# Patient Record
Sex: Female | Born: 1945
Health system: Southern US, Community
[De-identification: ages and names within clinical notes are randomized; demographics above are authoritative.]

## PROBLEM LIST (undated history)

## (undated) DIAGNOSIS — E039 Hypothyroidism, unspecified: Secondary | ICD-10-CM

## (undated) DIAGNOSIS — E785 Hyperlipidemia, unspecified: Secondary | ICD-10-CM

## (undated) HISTORY — PX: FACIAL FRACTURE SURGERY: SHX1570

## (undated) HISTORY — DX: Hypothyroidism, unspecified: E03.9

## (undated) HISTORY — DX: Hyperlipidemia, unspecified: E78.5

## (undated) HISTORY — PX: ABDOMINAL HYSTERECTOMY: SHX81

## (undated) HISTORY — PX: SPINE SURGERY: SHX786

---

## 1986-03-04 HISTORY — PX: SPINE SURGERY: SHX786

## 2006-03-04 DIAGNOSIS — I219 Acute myocardial infarction, unspecified: Secondary | ICD-10-CM

## 2006-03-04 HISTORY — DX: Acute myocardial infarction, unspecified: I21.9

## 2011-02-21 DIAGNOSIS — M810 Age-related osteoporosis without current pathological fracture: Secondary | ICD-10-CM

## 2011-02-21 DIAGNOSIS — M8580 Other specified disorders of bone density and structure, unspecified site: Secondary | ICD-10-CM | POA: Insufficient documentation

## 2011-02-21 HISTORY — DX: Age-related osteoporosis without current pathological fracture: M81.0

## 2013-09-09 DIAGNOSIS — E785 Hyperlipidemia, unspecified: Secondary | ICD-10-CM | POA: Insufficient documentation

## 2013-09-09 DIAGNOSIS — I5189 Other ill-defined heart diseases: Secondary | ICD-10-CM | POA: Insufficient documentation

## 2013-09-09 DIAGNOSIS — R002 Palpitations: Secondary | ICD-10-CM | POA: Insufficient documentation

## 2013-09-09 HISTORY — DX: Other ill-defined heart diseases: I51.89

## 2013-09-09 HISTORY — DX: Palpitations: R00.2

## 2013-09-30 ENCOUNTER — Ambulatory Visit: Payer: Self-pay | Admitting: Rheumatology

## 2013-10-12 DIAGNOSIS — M1711 Unilateral primary osteoarthritis, right knee: Secondary | ICD-10-CM

## 2013-10-12 DIAGNOSIS — S83289A Other tear of lateral meniscus, current injury, unspecified knee, initial encounter: Secondary | ICD-10-CM

## 2013-10-12 HISTORY — DX: Unilateral primary osteoarthritis, right knee: M17.11

## 2013-10-12 HISTORY — DX: Other tear of lateral meniscus, current injury, unspecified knee, initial encounter: S83.289A

## 2013-11-05 ENCOUNTER — Ambulatory Visit: Payer: Self-pay | Admitting: Unknown Physician Specialty

## 2013-12-02 DIAGNOSIS — Z9889 Other specified postprocedural states: Secondary | ICD-10-CM

## 2013-12-02 HISTORY — DX: Other specified postprocedural states: Z98.890

## 2014-06-15 ENCOUNTER — Ambulatory Visit
Admit: 2014-06-15 | Disposition: A | Discharge status: Home / Self Care | Payer: Self-pay | Attending: Nurse Practitioner | Admitting: Nurse Practitioner

## 2015-06-07 ENCOUNTER — Other Ambulatory Visit: Payer: Self-pay | Admitting: Nurse Practitioner

## 2015-06-07 DIAGNOSIS — Z1231 Encounter for screening mammogram for malignant neoplasm of breast: Secondary | ICD-10-CM

## 2016-07-10 LAB — HM COLONOSCOPY

## 2016-11-29 DIAGNOSIS — B009 Herpesviral infection, unspecified: Secondary | ICD-10-CM

## 2016-11-29 DIAGNOSIS — E039 Hypothyroidism, unspecified: Secondary | ICD-10-CM | POA: Insufficient documentation

## 2016-11-29 HISTORY — DX: Herpesviral infection, unspecified: B00.9

## 2016-11-29 HISTORY — DX: Hypothyroidism, unspecified: E03.9

## 2019-12-27 ENCOUNTER — Ambulatory Visit (INDEPENDENT_AMBULATORY_CARE_PROVIDER_SITE_OTHER): Payer: Medicare PPO | Admitting: Nurse Practitioner

## 2019-12-27 ENCOUNTER — Other Ambulatory Visit: Payer: Self-pay

## 2019-12-27 ENCOUNTER — Encounter: Payer: Self-pay | Admitting: Nurse Practitioner

## 2019-12-27 VITALS — BP 130/68 | HR 68 | Temp 97.3°F | Ht 58.5 in | Wt 112.0 lb

## 2019-12-27 DIAGNOSIS — E559 Vitamin D deficiency, unspecified: Secondary | ICD-10-CM

## 2019-12-27 DIAGNOSIS — M545 Low back pain, unspecified: Secondary | ICD-10-CM

## 2019-12-27 DIAGNOSIS — Z23 Encounter for immunization: Secondary | ICD-10-CM

## 2019-12-27 DIAGNOSIS — K5904 Chronic idiopathic constipation: Secondary | ICD-10-CM | POA: Insufficient documentation

## 2019-12-27 DIAGNOSIS — E039 Hypothyroidism, unspecified: Secondary | ICD-10-CM

## 2019-12-27 DIAGNOSIS — G8929 Other chronic pain: Secondary | ICD-10-CM | POA: Diagnosis not present

## 2019-12-27 DIAGNOSIS — Z780 Asymptomatic menopausal state: Secondary | ICD-10-CM

## 2019-12-27 DIAGNOSIS — E782 Mixed hyperlipidemia: Secondary | ICD-10-CM | POA: Diagnosis not present

## 2019-12-27 DIAGNOSIS — Z1231 Encounter for screening mammogram for malignant neoplasm of breast: Secondary | ICD-10-CM

## 2019-12-27 DIAGNOSIS — M2550 Pain in unspecified joint: Secondary | ICD-10-CM | POA: Diagnosis not present

## 2019-12-27 DIAGNOSIS — M8580 Other specified disorders of bone density and structure, unspecified site: Secondary | ICD-10-CM

## 2019-12-27 HISTORY — DX: Other chronic pain: G89.29

## 2019-12-27 HISTORY — DX: Low back pain, unspecified: M54.50

## 2019-12-27 HISTORY — DX: Chronic idiopathic constipation: K59.04

## 2019-12-27 LAB — CBC WITH DIFFERENTIAL/PLATELET
Basophils Absolute: 0 10*3/uL (ref 0.0–0.1)
Basophils Relative: 0.5 % (ref 0.0–3.0)
Eosinophils Absolute: 0.1 10*3/uL (ref 0.0–0.7)
Eosinophils Relative: 0.9 % (ref 0.0–5.0)
HCT: 38.3 % (ref 36.0–46.0)
Hemoglobin: 13 g/dL (ref 12.0–15.0)
Lymphocytes Relative: 26.7 % (ref 12.0–46.0)
Lymphs Abs: 1.9 10*3/uL (ref 0.7–4.0)
MCHC: 33.8 g/dL (ref 30.0–36.0)
MCV: 97.5 fl (ref 78.0–100.0)
Monocytes Absolute: 0.4 10*3/uL (ref 0.1–1.0)
Monocytes Relative: 5.8 % (ref 3.0–12.0)
Neutro Abs: 4.6 10*3/uL (ref 1.4–7.7)
Neutrophils Relative %: 66.1 % (ref 43.0–77.0)
Platelets: 212 10*3/uL (ref 150.0–400.0)
RBC: 3.93 Mil/uL (ref 3.87–5.11)
RDW: 12.8 % (ref 11.5–15.5)
WBC: 7 10*3/uL (ref 4.0–10.5)

## 2019-12-27 LAB — COMPREHENSIVE METABOLIC PANEL
ALT: 15 U/L (ref 0–35)
AST: 22 U/L (ref 0–37)
Albumin: 4.6 g/dL (ref 3.5–5.2)
Alkaline Phosphatase: 73 U/L (ref 39–117)
BUN: 8 mg/dL (ref 6–23)
CO2: 27 mEq/L (ref 19–32)
Calcium: 9.8 mg/dL (ref 8.4–10.5)
Chloride: 104 mEq/L (ref 96–112)
Creatinine, Ser: 0.63 mg/dL (ref 0.40–1.20)
GFR: 88.29 mL/min (ref >60.00)
Glucose, Bld: 77 mg/dL (ref 70–99)
Potassium: 4.1 mEq/L (ref 3.5–5.1)
Sodium: 141 mEq/L (ref 135–145)
Total Bilirubin: 0.6 mg/dL (ref 0.2–1.2)
Total Protein: 6.7 g/dL (ref 6.0–8.3)

## 2019-12-27 LAB — LIPID PANEL
Cholesterol: 184 mg/dL (ref 0–200)
HDL: 92.8 mg/dL (ref >39.00)
LDL Cholesterol: 75 mg/dL (ref 0–99)
NonHDL: 91.04
Total CHOL/HDL Ratio: 2
Triglycerides: 81 mg/dL (ref 0.0–149.0)
VLDL: 16.2 mg/dL (ref 0.0–40.0)

## 2019-12-27 LAB — T4, FREE: Free T4: 0.83 ng/dL (ref 0.60–1.60)

## 2019-12-27 LAB — SEDIMENTATION RATE: Sed Rate: 9 mm/hr (ref 0–30)

## 2019-12-27 LAB — CK: Total CK: 71 U/L (ref 7–177)

## 2019-12-27 LAB — TSH: TSH: 0.93 u[IU]/mL (ref 0.35–4.50)

## 2019-12-27 MED ORDER — SHINGRIX 50 MCG/0.5ML IM SUSR: 0.5000 mL | Freq: Once | INTRAMUSCULAR | 0 refills | Status: AC

## 2019-12-27 MED ORDER — SIMVASTATIN 20 MG PO TABS: 20.0000 mg | ORAL_TABLET | Freq: Every day | ORAL | 3 refills | Status: DC

## 2019-12-27 MED ORDER — MELOXICAM 7.5 MG PO TABS: 7.5000 mg | ORAL_TABLET | Freq: Every day | ORAL | 5 refills | Status: DC

## 2019-12-27 MED ORDER — LEVOTHYROXINE SODIUM 50 MCG PO TABS: 50.0000 ug | ORAL_TABLET | Freq: Every day | ORAL | 1 refills | Status: DC

## 2019-12-27 MED ORDER — GABAPENTIN 300 MG PO CAPS: 300.0000 mg | ORAL_CAPSULE | Freq: Two times a day (BID) | ORAL | 11 refills | Status: DC

## 2019-12-27 NOTE — Patient Instructions (Addendum)
Thank you for choosing [facility] for your health needs.  Go to lab for blood draw.  Increase gabapentin to 300mg  BID Start meloxicam 1tab daily with food. Do not take any OTC ibuprofen/naproxen/advil/aleve while taking meloxicam.  Let me know if you change your mind about outpatient PT. Start daily yoga exercise.  Low Back Sprain or Strain Rehab Ask your health care provider which exercises are safe for you. Do exercises exactly as told by your health care provider and adjust them as directed. It is normal to feel mild stretching, pulling, tightness, or discomfort as you do these exercises. Stop right away if you feel sudden pain or your pain gets worse. Do not begin these exercises until told by your health care provider. Stretching and range-of-motion exercises These exercises warm up your muscles and joints and improve the movement and flexibility of your back. These exercises also help to relieve pain, numbness, and tingling. Lumbar rotation  1. Lie on your back on a firm surface and bend your knees. 2. Straighten your arms out to your sides so each arm forms a 90-degree angle (right angle) with a side of your body. 3. Slowly move (rotate) both of your knees to one side of your body until you feel a stretch in your lower back (lumbar). Try not to let your shoulders lift off the floor. 4. Hold this position for __________ seconds. 5. Tense your abdominal muscles and slowly move your knees back to the starting position. 6. Repeat this exercise on the other side of your body. Repeat __________ times. Complete this exercise __________ times a day. Single knee to chest  1. Lie on your back on a firm surface with both legs straight. 2. Bend one of your knees. Use your hands to move your knee up toward your chest until you feel a gentle stretch in your lower back and buttock. ? Hold your leg in this position by holding on to the front of your knee. ? Keep your other leg as  straight as possible. 3. Hold this position for __________ seconds. 4. Slowly return to the starting position. 5. Repeat with your other leg. Repeat __________ times. Complete this exercise __________ times a day. Prone extension on elbows  1. Lie on your abdomen on a firm surface (prone position). 2. Prop yourself up on your elbows. 3. Use your arms to help lift your chest up until you feel a gentle stretch in your abdomen and your lower back. ? This will place some of your body weight on your elbows. If this is uncomfortable, try stacking pillows under your chest. ? Your hips should stay down, against the surface that you are lying on. Keep your hip and back muscles relaxed. 4. Hold this position for __________ seconds. 5. Slowly relax your upper body and return to the starting position. Repeat __________ times. Complete this exercise __________ times a day. Strengthening exercises These exercises build strength and endurance in your back. Endurance is the ability to use your muscles for a long time, even after they get tired. Pelvic tilt This exercise strengthens the muscles that lie deep in the abdomen. 1. Lie on your back on a firm surface. Bend your knees and keep your feet flat on the floor. 2. Tense your abdominal muscles. Tip your pelvis up toward the ceiling and flatten your lower back into the floor. ? To help with this exercise, you may place a small towel under your lower back and try to push your back  into the towel. 3. Hold this position for __________ seconds. 4. Let your muscles relax completely before you repeat this exercise. Repeat __________ times. Complete this exercise __________ times a day. Alternating arm and leg raises  1. Get on your hands and knees on a firm surface. If you are on a hard floor, you may want to use padding, such as an exercise mat, to cushion your knees. 2. Line up your arms and legs. Your hands should be directly below your shoulders, and your  knees should be directly below your hips. 3. Lift your left leg behind you. At the same time, raise your right arm and straighten it in front of you. ? Do not lift your leg higher than your hip. ? Do not lift your arm higher than your shoulder. ? Keep your abdominal and back muscles tight. ? Keep your hips facing the ground. ? Do not arch your back. ? Keep your balance carefully, and do not hold your breath. 4. Hold this position for __________ seconds. 5. Slowly return to the starting position. 6. Repeat with your right leg and your left arm. Repeat __________ times. Complete this exercise __________ times a day. Abdominal set with straight leg raise  1. Lie on your back on a firm surface. 2. Bend one of your knees and keep your other leg straight. 3. Tense your abdominal muscles and lift your straight leg up, 4-6 inches (10-15 cm) off the ground. 4. Keep your abdominal muscles tight and hold this position for __________ seconds. ? Do not hold your breath. ? Do not arch your back. Keep it flat against the ground. 5. Keep your abdominal muscles tense as you slowly lower your leg back to the starting position. 6. Repeat with your other leg. Repeat __________ times. Complete this exercise __________ times a day. Single leg lower with bent knees 1. Lie on your back on a firm surface. 2. Tense your abdominal muscles and lift your feet off the floor, one foot at a time, so your knees and hips are bent in 90-degree angles (right angles). ? Your knees should be over your hips and your lower legs should be parallel to the floor. 3. Keeping your abdominal muscles tense and your knee bent, slowly lower one of your legs so your toe touches the ground. 4. Lift your leg back up to return to the starting position. ? Do not hold your breath. ? Do not let your back arch. Keep your back flat against the ground. 5. Repeat with your other leg. Repeat __________ times. Complete this exercise __________  times a day. Posture and body mechanics Good posture and healthy body mechanics can help to relieve stress in your body's tissues and joints. Body mechanics refers to the movements and positions of your body while you do your daily activities. Posture is part of body mechanics. Good posture means:  Your spine is in its natural S-curve position (neutral).  Your shoulders are pulled back slightly.  Your head is not tipped forward. Follow these guidelines to improve your posture and body mechanics in your everyday activities. Standing   When standing, keep your spine neutral and your feet about hip width apart. Keep a slight bend in your knees. Your ears, shoulders, and hips should line up.  When you do a task in which you stand in one place for a long time, place one foot up on a stable object that is 2-4 inches (5-10 cm) high, such as a footstool. This helps  keep your spine neutral. Sitting   When sitting, keep your spine neutral and keep your feet flat on the floor. Use a footrest, if necessary, and keep your thighs parallel to the floor. Avoid rounding your shoulders, and avoid tilting your head forward.  When working at a desk or a computer, keep your desk at a height where your hands are slightly lower than your elbows. Slide your chair under your desk so you are close enough to maintain good posture.  When working at a computer, place your monitor at a height where you are looking straight ahead and you do not have to tilt your head forward or downward to look at the screen. Resting  When lying down and resting, avoid positions that are most painful for you.  If you have pain with activities such as sitting, bending, stooping, or squatting, lie in a position in which your body does not bend very much. For example, avoid curling up on your side with your arms and knees near your chest (fetal position).  If you have pain with activities such as standing for a long time or reaching  with your arms, lie with your spine in a neutral position and bend your knees slightly. Try the following positions: ? Lying on your side with a pillow between your knees. ? Lying on your back with a pillow under your knees. Lifting   When lifting objects, keep your feet at least shoulder width apart and tighten your abdominal muscles.  Bend your knees and hips and keep your spine neutral. It is important to lift using the strength of your legs, not your back. Do not lock your knees straight out.  Always ask for help to lift heavy or awkward objects. This information is not intended to replace advice given to you by your health care provider. Make sure you discuss any questions you have with your health care provider. Document Revised: 06/12/2018 Document Reviewed: 03/12/2018 Elsevier Patient Education  2020 [employer].

## 2019-12-27 NOTE — Assessment & Plan Note (Signed)
Repeat TSh and T4 today

## 2019-12-27 NOTE — Assessment & Plan Note (Signed)
Associated with AM stiffness lasting> 1hr. interferes with sleep. 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 She denies any falls in last 2years. Denies any radiculopathy and weakness No change with tylenol or ibuprofen Some improvement with gabapentin 3034mat hs. Denies any adverse side effects. No FHx of RA or lupus Due to home responsibilities she is not able to attend outpt PT sessions.  Normal CMP, TSH, ESR and CK Pending R-factor and vit. D Increase gabapentin to 30055mID Start meloxicam 7.5mg39mily Advised to start Yoga exercises at home F/up in 1mon75month

## 2019-12-27 NOTE — Progress Notes (Signed)
Subjective:    Patient ID: Lindsay Ferguson, female    DOB: 30-Jan-1946, 74 y.o.   MRN: [id number]  Patient presents today for establish care (new patient) and eval of chronic conditions  Reviewed office notes, lab results and radiology report from previous pcp with [facility].  HPI Chronic midline low back pain without sciatica Ongoing for years, Worse in last 2years. Associated with AM stiffness lasting> 1hr. interferes with sleep. 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 She denies any falls in last 2years. Denies any radiculopathy and weakness No change with tylenol or ibuprofen Some improvement with gabapentin 3020mat hs. Denies any adverse side effects. No FHx of RA or lupus Has FHx of severe OA. [facility], facet lumbar and scoliosis Due to home responsibilities she is not able to attend outpt PT sessions.  Normal CMP, TSH, ESR and CK Pending R-factor and vit. D Increase gabapentin to 30074mID Start meloxicam 7.5mg43mily Advised to start Yoga exercises at home F/up in 1mon56monthteopenia Last bone density 2015 per patient. Order entered for repeat dexa scan  Hypothyroidism, unspecified Repeat TSh and T4 today  Chronic idiopathic constipation Up to date with colonoscopy BM every other day with use of colace 200mg 79m Unable to afford linzess Consider amitiza if colace no longer effective  Hyperlipidemia Current use of zocor normal CK and sed rate LDL at goal Continue zocor Lipid Panel     Component Value Date/Time   CHOL 184 12/27/2019 1227   TRIG 81.0 12/27/2019 1227   HDL 92.80 12/27/2019 1227   CHOLHDL 2 12/27/2019 1227   VLDL 16.2 12/27/2019 1227   LDLCALC 75 12/27/2019 1227    Chronic pain of multiple joints Associated with AM stiffness lasting> 1hr. interferes with sleep. Reports increased stress and anxiety in last 27yrs b67yrse she became her sister's  caregiver. She denies any falls in last 2years. Denies any radiculopathy and weakness No change with tylenol or ibuprofen Some improvement with gabapentin 300mg at3m Denies any adverse side effects. No FHx of RA or lupus Due to home responsibilities she is not able to attend outpt PT sessions.  Normal CMP, TSH, ESR and CK Pending R-factor and vit. D Increase gabapentin to 300mg BID40mrt meloxicam 7.5mg daily61mvised to start Yoga exercises at home F/up in 50month.  P50month: normal, s/p total hystrectomy. AWV done 08/08/2019  Depression/Suicide: Depression screen PHQ 2/9 12/27/2019  Decreased Interest 0  Down, Depressed, Hopeless 0  PHQ - 2 Score 0  Altered sleeping 2  Tired, decreased energy 2  Change in appetite 0  Feeling bad or failure about yourself  0  Trouble concentrating 0  Moving slowly or fidgety/restless 0  Suicidal thoughts 0  PHQ-9 Score 4  Difficult doing work/chores Not difficult at all   Immunizations: (TDAP, Hep C screen, Pneumovax, Influenza, zoster)  Health Maintenance  Topic Date Due   Tetanus Vaccine  Never done   DEXA scan (bone density measurement)  Never done   Mammogram  08/30/2020   Colon Cancer Screening  07/02/2021   Flu Shot  Completed   COVID-19 Vaccine  Completed    Hepatitis C: One time screening is recommended by Center for Disease Control  (CDC) for  adults born from 1944 throug[birth year]65.   Completed   Pneumonia vaccines  Completed   Fall Risk: Fall Risk  12/27/2019  Falls in the past year? 0  Number falls in past yr: 0  Injury with Fall? 0  Risk for fall due to : No Fall Risks  Follow up Falls evaluation completed   Advanced Directive: Advanced Directives 12/27/2019  Does Patient Have a Medical Advance Directive? No  Would patient like information on creating a medical advance directive? Yes (MAU/Ambulatory/Procedural Areas - Information given)     Medications and allergies reviewed with patient and updated if  appropriate.  Patient Active Problem List   Diagnosis Date Noted   Chronic pain of multiple joints 12/27/2019   Chronic midline low back pain without sciatica 12/27/2019   Chronic idiopathic constipation 12/27/2019   Hypothyroidism, unspecified 11/29/2016   Status post arthroscopic surgery of right knee 12/02/2013   Primary osteoarthritis of right knee 10/12/2013   Hyperlipidemia 09/09/2013   Heart chamber dysfunction 09/09/2013   Osteopenia 02/21/2011    Current Outpatient Medications on File Prior to Visit  Medication Sig Dispense Refill   acyclovir (ZOVIRAX) 400 MG tablet Take 400 mg by mouth 4 (four) times daily as needed.     Aspirin Buf,CaCarb-MgCarb-MgO, 81 MG TABS Take 81 mg by mouth daily.     No current facility-administered medications on file prior to visit.    Past Medical History:  Diagnosis Date   Herpesviral infection, unspecified 11/29/2016   Hyperlipidemia    Hypothyroidism    Tear of lateral cartilage or meniscus of knee, current 10/12/2013    Past Surgical History:  Procedure Laterality Date   ABDOMINAL HYSTERECTOMY     FACIAL FRACTURE SURGERY     SPINE SURGERY      Social History   Socioeconomic History   Marital status: Married    Spouse name: Not on file   Number of children: Not on file   Years of education: Not on file   Highest education level: Not on file  Occupational History   Not on file  Tobacco Use   Smoking status: Never Smoker   Smokeless tobacco: Never Used  Vaping Use   Vaping Use: Never used  Substance and Sexual Activity   Alcohol use: Yes    Comment: Occasionally (wine)   Drug use: Never   Sexual activity: Not Currently  Other Topics Concern   Not on file  Social History Narrative   Not on file   Social Determinants of Health   Financial Resource Strain:    Difficulty of Paying Living Expenses: Not on file  Food Insecurity:    Worried About [place] in the Last Year:  Not on file   Ran Out of Food in the Last Year: Not on file  Transportation Needs:    Lack of Transportation (Medical): Not on file   Lack of Transportation (Non-Medical): Not on file  Physical Activity:    Days of Exercise per Week: Not on file   Minutes of Exercise per Session: Not on file  Stress:    Feeling of Stress : Not on file  Social Connections:    Frequency of Communication with Friends and Family: Not on file   Frequency of Social Gatherings with Friends and Family: Not on file   Attends Religious Services: Not on file   Active Member of Clubs or Organizations: Not on file   Attends Archivist Meetings: Not on file   Marital Status: Not on file    History reviewed. No pertinent family history.      ROS  Objective:   Vitals:   12/27/19 1129  BP: 130/68  Pulse: 68  Temp: (!) 97.3 F (36.3 C)  SpO2: 97%    Body mass index is 23.01 kg/m.   Physical Examination:  Physical Exam Vitals reviewed.  Cardiovascular:     Rate and Rhythm: Normal rate and regular rhythm.     Pulses: Normal pulses.     Heart sounds: Normal heart sounds.  Pulmonary:     Effort: Pulmonary effort is normal.     Breath sounds: Normal breath sounds.  Abdominal:     General: Bowel sounds are normal.     Palpations: Abdomen is soft.  Musculoskeletal:     Cervical back: Normal, normal range of motion and neck supple.     Thoracic back: Normal. No scoliosis.     Lumbar back: Negative right straight leg raise test and negative left straight leg raise test. Scoliosis present.     Right hip: Normal.     Left hip: Tenderness present. No bony tenderness or crepitus. Normal range of motion. Normal strength.     Right lower leg: No edema.     Left lower leg: No edema.  Lymphadenopathy:     Cervical: No cervical adenopathy.  Skin:    General: Skin is warm and dry.     Findings: No rash.  Neurological:     Mental Status: She is alert and oriented to person, place,  and time.  Psychiatric:        Attention and Perception: Attention normal.        Mood and Affect: Mood is anxious.        Speech: Speech normal.        Behavior: Behavior is cooperative.        Thought Content: Thought content normal.        Cognition and Memory: Cognition normal.        Judgment: Judgment normal.    ASSESSMENT and PLAN: This visit occurred during the SARS-CoV-2 public health emergency.  Safety protocols were in place, including screening questions prior to the visit, additional usage of staff PPE, and extensive cleaning of exam room while observing appropriate contact time as indicated for disinfecting solutions.   Johnni was seen today for establish care.  Diagnoses and all orders for this visit:  Hypothyroidism, unspecified type -     TSH -     T4, free -     levothyroxine (SYNTHROID) 50 MCG tablet; Take 1 tablet (50 mcg total) by mouth daily before breakfast.  Mixed hyperlipidemia -     Lipid panel -     simvastatin (ZOCOR) 20 MG tablet; Take 1 tablet (20 mg total) by mouth at bedtime.  Chronic midline low back pain without sciatica -     gabapentin (NEURONTIN) 300 MG capsule; Take 1 capsule (300 mg total) by mouth 2 (two) times daily. -     meloxicam (MOBIC) 7.5 MG tablet; Take 1 tablet (7.5 mg total) by mouth daily. With food  Chronic pain of multiple joints -     Comprehensive metabolic panel -     CBC with Differential/Platelet -     CK (Creatine Kinase) -     gabapentin (NEURONTIN) 300 MG capsule; Take 1 capsule (300 mg total) by mouth 2 (two) times daily. -     meloxicam (MOBIC) 7.5 MG tablet; Take 1 tablet (7.5 mg total) by mouth daily. With food -     Sedimentation rate -     Rheumatoid Factor  Vitamin D insufficiency -     Vitamin  D 1,25 dihydroxy  Asymptomatic age-related postmenopausal state -     DG Bone Density; Future  Breast cancer screening by mammogram -     MM DIGITAL SCREENING BILATERAL; Future  Need for zoster vaccine -      Zoster Vaccine Adjuvanted [facility]) injection; Inject 0.5 mLs into the muscle once for 1 dose.  Chronic idiopathic constipation  Osteopenia, unspecified location      Problem List Items Addressed This Visit      Digestive   Chronic idiopathic constipation    Up to date with colonoscopy BM every other day with use of colace 242m EOD. Unable to afford linzess Consider amitiza if colace no longer effective        Endocrine   Hypothyroidism, unspecified - Primary    Repeat TSh and T4 today      Relevant Medications   levothyroxine (SYNTHROID) 50 MCG tablet   Other Relevant Orders   TSH (Completed)   T4, free (Completed)     Musculoskeletal and Integument   Osteopenia    Last bone density 2015 per patient. Order entered for repeat dexa scan        Other   Chronic midline low back pain without sciatica    Ongoing for years, Worse in last 2years. Associated with AM stiffness lasting> 1hr. interferes with sleep. Reports increased stress and anxiety in last 22yrbecause she became her sister's caregiver. She denies any falls in last 2years. Denies any radiculopathy and weakness No change with tylenol or ibuprofen Some improvement with gabapentin 3004mt hs. Denies any adverse side effects. No FHx of RA or lupus Has FHx of severe OA. [facility] facet lumbar and scoliosis Due to home responsibilities she is not able to attend outpt PT sessions.  Normal CMP, TSH, ESR and CK Pending R-factor and vit. D Increase gabapentin to 300m56mD Start meloxicam 7.5mg 46mly Advised to start Yoga exercises at home F/up in 1mont54month Relevant Medications   Aspirin Buf,CaCarb-MgCarb-MgO, 81 MG TABS   gabapentin (NEURONTIN) 300 MG capsule   meloxicam (MOBIC) 7.5 MG tablet   Chronic pain of multiple joints    Associated with AM stiffness lasting> 1hr. interferes with sleep. Reports increased stress and anxiety in last 44yrs b944yrse she  became her sister's caregiver. She denies any falls in last 2years. Denies any radiculopathy and weakness No change with tylenol or ibuprofen Some improvement with gabapentin 300mg at46m Denies any adverse side effects. No FHx of RA or lupus Due to home responsibilities she is not able to attend outpt PT sessions.  Normal CMP, TSH, ESR and CK Pending R-factor and vit. D Increase gabapentin to 300mg BID30mrt meloxicam 7.5mg daily66mvised to start Yoga exercises at home F/up in 7month.   79monthvant Medications   Aspirin Buf,CaCarb-MgCarb-MgO, 81 MG TABS   gabapentin (NEURONTIN) 300 MG capsule   meloxicam (MOBIC) 7.5 MG tablet   Other Relevant Orders   Comprehensive metabolic panel (Completed)   CBC with Differential/Platelet (Completed)   CK (Creatine Kinase) (Completed)   Sedimentation rate (Completed)   Rheumatoid Factor   Hyperlipidemia    Current use of zocor normal CK and sed rate LDL at goal Continue zocor Lipid Panel     Component Value Date/Time   CHOL 184 12/27/2019 1227   TRIG 81.0 12/27/2019 1227   HDL 92.80 12/27/2019 1227   CHOLHDL 2 12/27/2019 1227   VLDL 16.2 12/27/2019  [place] 75 12/27/2019 1227        Relevant Medications   Aspirin Buf,CaCarb-MgCarb-MgO, 81 MG TABS   simvastatin (ZOCOR) 20 MG tablet   Other Relevant Orders   Lipid panel (Completed)    Other Visit Diagnoses    Vitamin D insufficiency       Relevant Orders   Vitamin D 1,25 dihydroxy   Asymptomatic age-related postmenopausal state       Relevant Orders   DG Bone Density   Breast cancer screening by mammogram       Relevant Orders   MM DIGITAL SCREENING BILATERAL   Need for zoster vaccine       Relevant Medications   Zoster Vaccine Adjuvanted [facility]) injection      Follow up: Return in about 4 weeks (around 01/24/2020) for chronic back and joint pain (F2F, 50mns).  CWilfred Lacy NP

## 2019-12-27 NOTE — Assessment & Plan Note (Addendum)
Current use of zocor normal CK and sed rate LDL at goal Continue zocor Lipid Panel     Component Value Date/Time   CHOL 184 12/27/2019 1227   TRIG 81.0 12/27/2019 1227   HDL 92.80 12/27/2019 1227   CHOLHDL 2 12/27/2019 1227   VLDL 16.2 12/27/2019 1227   LDLCALC 75 12/27/2019 1227

## 2019-12-27 NOTE — Assessment & Plan Note (Addendum)
Ongoing for years, Worse in last 2years. Associated with AM stiffness lasting> 1hr. interferes with sleep. 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 She denies any falls in last 2years. Denies any radiculopathy and weakness No change with tylenol or ibuprofen Some improvement with gabapentin 3012mat hs. Denies any adverse side effects. No FHx of RA or lupus Has FHx of severe OA. [facility] facet lumbar and scoliosis Due to home responsibilities she is not able to attend outpt PT sessions.  Normal CMP, TSH, ESR and CK Pending R-factor and vit. D Increase gabapentin to 30075mID Start meloxicam 7.5mg45mily Advised to start Yoga exercises at home F/up in 1mon64month

## 2019-12-27 NOTE — Assessment & Plan Note (Addendum)
Last bone density 2015 per patient. Order entered for repeat dexa scan

## 2019-12-27 NOTE — Assessment & Plan Note (Signed)
Up to date with colonoscopy BM every other day with use of colace 200mg  EOD. Unable to afford linzess Consider amitiza if colace no longer effective

## 2019-12-28 LAB — RHEUMATOID FACTOR: Rhuematoid fact SerPl-aCnc: <14 IU/mL (ref <14)

## 2019-12-31 LAB — VITAMIN D 1,25 DIHYDROXY
Vitamin D 1, 25 (OH)2 Total: 50 pg/mL (ref 18–72)
Vitamin D2 1, 25 (OH)2: <8 pg/mL
Vitamin D3 1, 25 (OH)2: 50 pg/mL

## 2020-01-03 ENCOUNTER — Telehealth: Payer: Self-pay | Admitting: Nurse Practitioner

## 2020-01-03 NOTE — Telephone Encounter (Signed)
Patient declined AWV stating she is caring for her sister who had a stroke and do want to do the AWV due to her time restraint

## 2020-02-01 ENCOUNTER — Other Ambulatory Visit: Payer: Self-pay

## 2020-02-02 ENCOUNTER — Ambulatory Visit (INDEPENDENT_AMBULATORY_CARE_PROVIDER_SITE_OTHER): Payer: Medicare PPO | Admitting: Nurse Practitioner

## 2020-02-02 ENCOUNTER — Encounter: Payer: Self-pay | Admitting: Nurse Practitioner

## 2020-02-02 VITALS — BP 104/70 | HR 72 | Temp 97.4°F | Ht 59.5 in | Wt 111.8 lb

## 2020-02-02 DIAGNOSIS — G8929 Other chronic pain: Secondary | ICD-10-CM | POA: Diagnosis not present

## 2020-02-02 DIAGNOSIS — M545 Low back pain, unspecified: Secondary | ICD-10-CM

## 2020-02-02 DIAGNOSIS — M2550 Pain in unspecified joint: Secondary | ICD-10-CM | POA: Diagnosis not present

## 2020-02-02 NOTE — Progress Notes (Signed)
   Subjective:  Patient ID: Lindsay Ferguson, female    DOB: 06-15-1945  Age: 74 y.o. MRN: [id number]  CC: Follow-up (1 month f/u on chronic back pain. Pt states since starting medication she has had lots of improvement. )  HPI  Chronic midline low back pain without sciatica Reports significant improvement in back and joint pain with current use of meloxicam and gabapentin BID. Also reports improved mood and sleep. Denies any adverse side effects with medications. Maintain current dose.  Reviewed past Medical, Social and Family history today.  Outpatient Medications Prior to Visit  Medication Sig Dispense Refill  . acyclovir (ZOVIRAX) 400 MG tablet Take 400 mg by mouth 4 (four) times daily as needed.    . Aspirin Buf,CaCarb-MgCarb-MgO, 81 MG TABS Take 81 mg by mouth daily.    Marland Kitchen gabapentin (NEURONTIN) 300 MG capsule Take 1 capsule (300 mg total) by mouth 2 (two) times daily. 60 capsule 11  . levothyroxine (SYNTHROID) 50 MCG tablet Take 1 tablet (50 mcg total) by mouth daily before breakfast. 90 tablet 1  . meloxicam (MOBIC) 7.5 MG tablet Take 1 tablet (7.5 mg total) by mouth daily. With food 30 tablet 5  . simvastatin (ZOCOR) 20 MG tablet Take 1 tablet (20 mg total) by mouth at bedtime. 90 tablet 3   No facility-administered medications prior to visit.    ROS See HPI  Objective:  BP 104/70 (BP Location: Left Arm, Patient Position: Sitting, Cuff Size: Normal)   Pulse 72   Temp (!) 97.4 F (36.3 C) (Temporal)   Ht 4' 11.5" (1.511 m)   Wt 111 lb 12.8 oz (50.7 kg)   SpO2 98%   BMI 22.20 kg/m   Physical Exam Cardiovascular:     Rate and Rhythm: Normal rate.     Pulses: Normal pulses.  Pulmonary:     Effort: Pulmonary effort is normal.  Musculoskeletal:     Right lower leg: No edema.     Left lower leg: No edema.  Neurological:     Mental Status: She is alert and oriented to person, place, and time.    Assessment & Plan:  This visit occurred during the SARS-CoV-2  public health emergency.  Safety protocols were in place, including screening questions prior to the visit, additional usage of staff PPE, and extensive cleaning of exam room while observing appropriate contact time as indicated for disinfecting solutions.   Fable was seen today for follow-up.  Diagnoses and all orders for this visit:  Chronic pain of multiple joints  Chronic midline low back pain without sciatica   Problem List Items Addressed This Visit      Other   Chronic midline low back pain without sciatica    Reports significant improvement in back and joint pain with current use of meloxicam and gabapentin BID. Also reports improved mood and sleep. Denies any adverse side effects with medications. Maintain current dose.      Chronic pain of multiple joints - Primary      Follow-up: Return in about 6 months (around 08/02/2020) for hyperlipidemia hypothyroidism (fasting).  Alysia Penna, NP

## 2020-02-02 NOTE — Assessment & Plan Note (Signed)
Reports significant improvement in back and joint pain with current use of meloxicam and gabapentin BID. Also reports improved mood and sleep. Denies any adverse side effects with medications. Maintain current dose.

## 2020-02-14 ENCOUNTER — Telehealth: Payer: Self-pay

## 2020-02-14 NOTE — Telephone Encounter (Signed)
Patient calling and explained that she has had some  bleeding from both big toe nails, and that it looks infected to her, x 3 days.  Pt said that she read that this is a side affect from Mobic and would like call back or a Mychart message sent on what she should do.  Please advise.

## 2020-02-15 NOTE — Telephone Encounter (Signed)
Pt states she is only comfortable coming to this office and does not want to try another [place] location nor does she want to go to urgent care. Appt has been scheduled for tomorrow.

## 2020-02-15 NOTE — Telephone Encounter (Signed)
Needs an appt. Ok to schedule with another provider 

## 2020-02-16 ENCOUNTER — Other Ambulatory Visit: Payer: Self-pay

## 2020-02-16 ENCOUNTER — Ambulatory Visit (INDEPENDENT_AMBULATORY_CARE_PROVIDER_SITE_OTHER): Payer: Medicare PPO | Admitting: Nurse Practitioner

## 2020-02-16 ENCOUNTER — Encounter: Payer: Self-pay | Admitting: Nurse Practitioner

## 2020-02-16 VITALS — BP 130/76 | HR 70 | Temp 97.5°F | Ht 59.5 in | Wt 111.4 lb

## 2020-02-16 DIAGNOSIS — S90229A Contusion of unspecified lesser toe(s) with damage to nail, initial encounter: Secondary | ICD-10-CM

## 2020-02-16 NOTE — Patient Instructions (Signed)
Subungual Hematoma A subungual hematoma is a collection of blood under a fingernail or toenail. It can cause pain and a blue area under the nail. What are the causes? This condition is caused by an injury to a finger or toe that breaks a blood vessel under the nail. It can result from:  A hard, direct hit to a finger or toe (crush injury).  Pressure being put on a finger or toe over and over again, such as pressure on a toe from running. What are the signs or symptoms?   A blue or dark blue color under the nail.  Pain or throbbing in the injured area. How is this treated? Treatment is often not needed for this condition. The pain often goes away in a few days, and the dark color under the nail will go away as the nail grows. If treatment is needed, your doctor may:  Do a procedure to drain the blood from under the nail. This may be done if you have a lot of pain or if a lot of blood collects under the nail.  Remove the nail. This may be done if there is a cut under the nail that needs stitches (sutures). Follow these instructions at home: Managing pain, stiffness, and swelling   If told, put ice on the area. ? Put ice in a plastic bag. ? Place a towel between your skin and the bag. ? Leave the ice on for 20 minutes, 2-3 times a day.  Raise (elevate) the injured finger or toe above the level of your heart while you are sitting or lying down. Doing this will help with pain and swelling. Injury care  Follow instructions from your doctor about how to take care of your injury. Make sure you: ? Change any bandage (dressing) as told by your doctor. ? Wash your hands with soap and water before you change your bandage. If you cannot use soap and water, use hand sanitizer. ? Leave stitches (sutures) in place. You may have these if your doctor fixed a cut under the nail. The stitches may need to stay in place for 2 weeks or longer.  If part of your nail falls off, gently trim the rest of  the nail. General instructions  Take over-the-counter and prescription medicines only as told by your doctor.  Return to your normal activities as told by your doctor. Ask your doctor what activities are safe for you.  Keep all follow-up visits as told by your doctor. This is important. Contact a doctor if you have:  Pain that is not helped by medicine.  A fever.  Redness, swelling, or pain around your nail. Get help right away if you have:  Fluid, blood, or pus coming from your nail. Summary  A subungual hematoma is a collection of blood under a fingernail or toenail.  It can cause pain and a blue area under the nail.  Treatment is often not needed for this condition.  Raise the injured finger or toe above the level of your heart while you are sitting or lying down. This information is not intended to replace advice given to you by your health care provider. Make sure you discuss any questions you have with your health care provider. Document Revised: 07/24/2017 Document Reviewed: 07/24/2017 Elsevier Patient Education  2020 [employer] Paronychia Paronychia is an infection of the skin. It happens near a fingernail or toenail. It may cause pain and swelling around the nail. In some cases, a fluid-filled bump (  abscess) can form near or under the nail. Usually, this condition is not serious, and it clears up with treatment. Follow these instructions at home: Wound care  Keep the affected area clean.  Soak the fingers or toes in warm water as told by your doctor. You may be told to do this for 20 minutes, 2-3 times a day.  Keep the area dry when you are not soaking it.  Do not try to drain a fluid-filled bump on your own.  Follow instructions from your doctor about how to take care of the affected area. Make sure you: ? Wash your hands with soap and water before you change your bandage (dressing). If you cannot use soap and water, use hand sanitizer. ? Change your  bandage as told by your doctor.  If you had a fluid-filled bump and your doctor drained it, check the area every day for signs of infection. Check for: ? Redness, swelling, or pain. ? Fluid or blood. ? Warmth. ? Pus or a bad smell. Medicines   Take over-the-counter and prescription medicines only as told by your doctor.  If you were prescribed an antibiotic medicine, take it as told by your doctor. Do not stop taking it even if you start to feel better. General instructions  Avoid touching any chemicals.  Do not pick at the affected area. Prevention  To prevent this condition from happening again: ? Wear rubber gloves when putting your hands in water for washing dishes or other tasks. ? Wear gloves if your hands might touch cleaners or chemicals. ? Avoid injuring your nails or fingertips. ? Do not bite your nails or tear hangnails. ? Do not cut your nails very short. ? Do not cut the skin at the base and sides of the nail (cuticles). ? Use clean nail clippers or scissors when trimming nails. Contact a doctor if:  You feel worse.  You do not get better.  You have more fluid, blood, or pus coming from the affected area.  Your finger or knuckle is swollen or is hard to move. Get help right away if you have:  A fever or chills.  Redness spreading from the affected area.  Pain in a joint or muscle. Summary  Paronychia is an infection of the skin. It happens near a fingernail or toenail.  This condition may cause pain and swelling around the nail.  Soak the fingers or toes in warm water as told by your doctor.  Usually, this condition is not serious, and it clears up with treatment. This information is not intended to replace advice given to you by your health care provider. Make sure you discuss any questions you have with your health care provider. Document Revised: 03/07/2017 Document Reviewed: 03/03/2017 Elsevier Patient Education  2020 [employer].

## 2020-02-16 NOTE — Progress Notes (Signed)
Subjective:  Patient ID: Lindsay Ferguson, female    DOB: 1946/01/13  Age: 74 y.o. MRN: [id number]  CC: Acute Visit (Pt c/o bleeding from her big toe on both of her feet. Pt states she read this was a side effect of one of her medications (meloxicam) and wants to know if she should stop the medicine. )  Toe Pain  The incident occurred 5 to 7 days ago. The injury mechanism is unknown. The pain is present in the left toes and right toes. The pain is at a severity of 0/10. The patient is experiencing no pain. Pertinent negatives include no inability to bear weight, loss of motion, loss of sensation, muscle weakness, numbness or tingling.   Reviewed past Medical, Social and Family history today.  Outpatient Medications Prior to Visit  Medication Sig Dispense Refill   acyclovir (ZOVIRAX) 400 MG tablet Take 400 mg by mouth 4 (four) times daily as needed.     Aspirin Buf,CaCarb-MgCarb-MgO, 81 MG TABS Take 81 mg by mouth daily.     gabapentin (NEURONTIN) 300 MG capsule Take 1 capsule (300 mg total) by mouth 2 (two) times daily. 60 capsule 11   levothyroxine (SYNTHROID) 50 MCG tablet Take 1 tablet (50 mcg total) by mouth daily before breakfast. 90 tablet 1   meloxicam (MOBIC) 7.5 MG tablet Take 1 tablet (7.5 mg total) by mouth daily. With food 30 tablet 5   simvastatin (ZOCOR) 20 MG tablet Take 1 tablet (20 mg total) by mouth at bedtime. 90 tablet 3   No facility-administered medications prior to visit.    ROS See HPI  Objective:  BP 130/76 (BP Location: Left Arm, Patient Position: Sitting, Cuff Size: Normal)    Pulse 70    Temp (!) 97.5 F (36.4 C) (Temporal)    Ht 4' 11.5" (1.511 m)    Wt 111 lb 6.4 oz (50.5 kg)    SpO2 98%    BMI 22.12 kg/m   Physical Exam Cardiovascular:     Pulses:          Dorsalis pedis pulses are 2+ on the right side and 2+ on the left side.       Posterior tibial pulses are 2+ on the right side and 2+ on the left side.  Pulmonary:     Effort:  Pulmonary effort is normal.  Musculoskeletal:     Right lower leg: No edema.     Left lower leg: No edema.  Feet:     Right foot:     Skin integrity: No erythema, callus or fissure.     Toenail Condition: Right toenails are abnormally thick.     Left foot:     Skin integrity: No erythema, callus or fissure.     Toenail Condition: Left toenails are abnormally thick.  Neurological:     Mental Status: She is alert and oriented to person, place, and time.    Assessment & Plan:  This visit occurred during the SARS-CoV-2 public health emergency.  Safety protocols were in place, including screening questions prior to the visit, additional usage of staff PPE, and extensive cleaning of exam room while observing appropriate contact time as indicated for disinfecting solutions.   Lindsay Ferguson was seen today for acute visit.  Diagnoses and all orders for this visit:  Subungual hematoma of toe, unspecified laterality, initial encounter  provided education on possible side effects of meloxicam and cause of recent toe pain and bleeding from the cuticle.Re assured patient that current subungual  hematoma is related to toenail trauma and not medication side effect. She verbalized understanding.  Problem List Items Addressed This Visit   None   Visit Diagnoses    Subungual hematoma of toe, unspecified laterality, initial encounter    -  Primary      Follow-up: No follow-ups on file.  Alysia Penna, NP

## 2020-03-09 LAB — HM MAMMOGRAPHY

## 2020-03-09 LAB — HM DEXA SCAN: HM Dexa Scan: 0.925

## 2020-06-22 ENCOUNTER — Other Ambulatory Visit: Payer: Self-pay | Admitting: Nurse Practitioner

## 2020-06-22 DIAGNOSIS — G8929 Other chronic pain: Secondary | ICD-10-CM

## 2020-06-22 DIAGNOSIS — M545 Low back pain, unspecified: Secondary | ICD-10-CM

## 2020-07-26 ENCOUNTER — Other Ambulatory Visit: Payer: Self-pay | Admitting: Nurse Practitioner

## 2020-07-26 DIAGNOSIS — E039 Hypothyroidism, unspecified: Secondary | ICD-10-CM

## 2020-08-03 ENCOUNTER — Ambulatory Visit: Payer: Medicare PPO | Admitting: Nurse Practitioner

## 2020-08-09 ENCOUNTER — Encounter: Payer: Self-pay | Admitting: Nurse Practitioner

## 2020-08-09 ENCOUNTER — Ambulatory Visit: Payer: Medicare PPO | Admitting: Nurse Practitioner

## 2020-08-09 ENCOUNTER — Other Ambulatory Visit: Payer: Self-pay

## 2020-08-09 VITALS — BP 130/70 | HR 64 | Temp 97.0°F | Ht 59.5 in | Wt 112.0 lb

## 2020-08-09 DIAGNOSIS — M545 Low back pain, unspecified: Secondary | ICD-10-CM

## 2020-08-09 DIAGNOSIS — M2550 Pain in unspecified joint: Secondary | ICD-10-CM | POA: Diagnosis not present

## 2020-08-09 DIAGNOSIS — E782 Mixed hyperlipidemia: Secondary | ICD-10-CM

## 2020-08-09 DIAGNOSIS — E039 Hypothyroidism, unspecified: Secondary | ICD-10-CM | POA: Diagnosis not present

## 2020-08-09 DIAGNOSIS — G8929 Other chronic pain: Secondary | ICD-10-CM

## 2020-08-09 LAB — BASIC METABOLIC PANEL
BUN: 17 mg/dL (ref 6–23)
CO2: 25 mEq/L (ref 19–32)
Calcium: 9.4 mg/dL (ref 8.4–10.5)
Chloride: 107 mEq/L (ref 96–112)
Creatinine, Ser: 0.67 mg/dL (ref 0.40–1.20)
GFR: 86.61 mL/min (ref >60.00)
Glucose, Bld: 85 mg/dL (ref 70–99)
Potassium: 4.1 mEq/L (ref 3.5–5.1)
Sodium: 142 mEq/L (ref 135–145)

## 2020-08-09 LAB — HEPATIC FUNCTION PANEL
ALT: 11 U/L (ref 0–35)
AST: 19 U/L (ref 0–37)
Albumin: 4.4 g/dL (ref 3.5–5.2)
Alkaline Phosphatase: 72 U/L (ref 39–117)
Bilirubin, Direct: 0.1 mg/dL (ref 0.0–0.3)
Total Bilirubin: 0.6 mg/dL (ref 0.2–1.2)
Total Protein: 6.8 g/dL (ref 6.0–8.3)

## 2020-08-09 LAB — TSH: TSH: 1.33 u[IU]/mL (ref 0.35–4.50)

## 2020-08-09 LAB — LIPID PANEL
Cholesterol: 181 mg/dL (ref 0–200)
HDL: 92.1 mg/dL (ref >39.00)
LDL Cholesterol: 76 mg/dL (ref 0–99)
NonHDL: 89.16
Total CHOL/HDL Ratio: 2
Triglycerides: 67 mg/dL (ref 0.0–149.0)
VLDL: 13.4 mg/dL (ref 0.0–40.0)

## 2020-08-09 LAB — T4, FREE: Free T4: 0.93 ng/dL (ref 0.60–1.60)

## 2020-08-09 MED ORDER — SIMVASTATIN 20 MG PO TABS: 20.0000 mg | ORAL_TABLET | Freq: Every day | ORAL | 3 refills | Status: DC

## 2020-08-09 MED ORDER — MELOXICAM 7.5 MG PO TABS: ORAL_TABLET | ORAL | 3 refills | Status: DC

## 2020-08-09 MED ORDER — LEVOTHYROXINE SODIUM 50 MCG PO TABS: ORAL_TABLET | ORAL | 1 refills | Status: DC

## 2020-08-09 MED ORDER — GABAPENTIN 300 MG PO CAPS: 300.0000 mg | ORAL_CAPSULE | Freq: Two times a day (BID) | ORAL | 3 refills | Status: DC

## 2020-08-09 NOTE — Assessment & Plan Note (Addendum)
repeat lipid panel: ASCVD risk of 12.8% Continue zocor

## 2020-08-09 NOTE — Assessment & Plan Note (Addendum)
Repeat TSH and T4: stable Refill sent  BP Readings from Last 3 Encounters:  08/09/20 130/70  02/16/20 130/76  02/02/20 104/70   Wt Readings from Last 3 Encounters:  08/09/20 112 lb (50.8 kg)  02/16/20 111 lb 6.4 oz (50.5 kg)  02/02/20 111 lb 12.8 oz (50.7 kg)

## 2020-08-09 NOTE — Progress Notes (Signed)
Subjective:  Patient ID: Lindsay Ferguson, female    DOB: [birth year]  Age: 75 y.o. MRN: [id number]  CC: Follow-up (6 month f/u on hyperlipidemia and hypothyroidisim. Pt is fasting)  HPI  Chronic pain of multiple joints Improved with meloxicam and gabapentin BID Maintain current medication  Hyperlipidemia repeat lipid panel  Hypothyroidism, unspecified Repeat TSH and T4   Reviewed past Medical, Social and Family history today.  Outpatient Medications Prior to Visit  Medication Sig Dispense Refill  . acyclovir (ZOVIRAX) 400 MG tablet Take 400 mg by mouth 4 (four) times daily as needed.    . Aspirin Buf,CaCarb-MgCarb-MgO, 81 MG TABS Take 81 mg by mouth daily.    Marland Kitchen gabapentin (NEURONTIN) 300 MG capsule Take 1 capsule (300 mg total) by mouth 2 (two) times daily. 60 capsule 11  . levothyroxine (SYNTHROID) 50 MCG tablet TAKE 1 TABLET(50 MCG) BY MOUTH DAILY BEFORE BREAKFAST 30 tablet 0  . levothyroxine (SYNTHROID) 50 MCG tablet Take 1 tablet by mouth daily.    . meloxicam (MOBIC) 7.5 MG tablet TAKE 1 TABLET(7.5 MG) BY MOUTH DAILY WITH FOOD 30 tablet 5  . simvastatin (ZOCOR) 20 MG tablet Take 1 tablet (20 mg total) by mouth at bedtime. 90 tablet 3   No facility-administered medications prior to visit.    ROS See HPI  Objective:  BP 130/70 (BP Location: Left Arm, Patient Position: Sitting, Cuff Size: Normal)   Pulse 64   Temp (!) 97 F (36.1 C) (Temporal)   Ht 4' 11.5" (1.511 m)   Wt 112 lb (50.8 kg)   SpO2 96%   BMI 22.24 kg/m   Physical Exam Vitals reviewed.  Cardiovascular:     Rate and Rhythm: Normal rate and regular rhythm.     Pulses: Normal pulses.     Heart sounds: Normal heart sounds.  Pulmonary:     Effort: Pulmonary effort is normal.     Breath sounds: Normal breath sounds.  Musculoskeletal:     Right lower leg: No edema.  Neurological:     Mental Status: She is alert and oriented to person, place, and time.  Psychiatric:        Mood and Affect:  Mood is anxious.        Behavior: Behavior is cooperative.        Thought Content: Thought content normal.        Cognition and Memory: Cognition and memory normal.        Judgment: Judgment normal.    Assessment & Plan:  This visit occurred during the SARS-CoV-2 public health emergency.  Safety protocols were in place, including screening questions prior to the visit, additional usage of staff PPE, and extensive cleaning of exam room while observing appropriate contact time as indicated for disinfecting solutions.   Lluvia was seen today for follow-up.  Diagnoses and all orders for this visit:  Hypothyroidism, unspecified type -     TSH -     T4, free -     levothyroxine (SYNTHROID) 50 MCG tablet; TAKE 1 TABLET(50 MCG) BY MOUTH DAILY BEFORE BREAKFAST  Mixed hyperlipidemia -     Lipid panel -     Hepatic function panel -     Basic metabolic panel -     simvastatin (ZOCOR) 20 MG tablet; Take 1 tablet (20 mg total) by mouth at bedtime.  Chronic pain of multiple joints -     gabapentin (NEURONTIN) 300 MG capsule; Take 1 capsule (300 mg total) by mouth 2 (two)  times daily. -     meloxicam (MOBIC) 7.5 MG tablet; TAKE 1 TABLET(7.5 MG) BY MOUTH DAILY WITH FOOD  Chronic midline low back pain without sciatica -     gabapentin (NEURONTIN) 300 MG capsule; Take 1 capsule (300 mg total) by mouth 2 (two) times daily. -     meloxicam (MOBIC) 7.5 MG tablet; TAKE 1 TABLET(7.5 MG) BY MOUTH DAILY WITH FOOD    Problem List Items Addressed This Visit      Endocrine   Hypothyroidism, unspecified - Primary    Repeat TSH and T4      Relevant Medications   levothyroxine (SYNTHROID) 50 MCG tablet   Other Relevant Orders   TSH (Completed)   T4, free (Completed)     Other   Chronic midline low back pain without sciatica   Relevant Medications   gabapentin (NEURONTIN) 300 MG capsule   meloxicam (MOBIC) 7.5 MG tablet   Chronic pain of multiple joints    Improved with meloxicam and gabapentin  BID Maintain current medication      Relevant Medications   gabapentin (NEURONTIN) 300 MG capsule   meloxicam (MOBIC) 7.5 MG tablet   Hyperlipidemia    repeat lipid panel      Relevant Medications   simvastatin (ZOCOR) 20 MG tablet   Other Relevant Orders   Lipid panel (Completed)   Hepatic function panel (Completed)   Basic metabolic panel (Completed)      Follow-up: Return in about 6 months (around 02/08/2021) for hyperlipidemia and Hypothyrodism.  Alysia Penna, NP

## 2020-08-09 NOTE — Patient Instructions (Signed)
Go to lab for blood draw  Ok to use tylenol 500-650mg  BID with current dose of meloxicam to help with joint pain.  Also try Co Q10 supplement 1tab daily from over the counter.

## 2020-08-09 NOTE — Assessment & Plan Note (Addendum)
Improved with meloxicam and gabapentin BID Maintain current medication

## 2020-11-12 ENCOUNTER — Telehealth: Payer: Self-pay

## 2020-11-12 NOTE — Telephone Encounter (Signed)
Pt declined to schedule appt on today. Pt will call back on a later date to schedule appoint for AWV

## 2021-01-18 ENCOUNTER — Other Ambulatory Visit: Payer: Self-pay | Admitting: Nurse Practitioner

## 2021-01-18 DIAGNOSIS — M2550 Pain in unspecified joint: Secondary | ICD-10-CM

## 2021-01-18 DIAGNOSIS — G8929 Other chronic pain: Secondary | ICD-10-CM

## 2021-01-18 DIAGNOSIS — M545 Low back pain, unspecified: Secondary | ICD-10-CM

## 2021-02-01 ENCOUNTER — Other Ambulatory Visit: Payer: Self-pay

## 2021-02-01 MED ORDER — ACYCLOVIR 400 MG PO TABS: 400.0000 mg | ORAL_TABLET | Freq: Four times a day (QID) | ORAL | 1 refills | Status: DC | PRN

## 2021-02-01 NOTE — Telephone Encounter (Signed)
Chart supports Rx Last seen 08/2020 Scheduled patient appointment for 03/21/2021

## 2021-02-01 NOTE — Telephone Encounter (Signed)
Pt would like a refill on medication. Pt states she has not had a Rx for this since last year because she had some that lasted until last week. Pt would like to know if you will fill this Rx for her. Please advise.

## 2021-02-27 ENCOUNTER — Other Ambulatory Visit: Payer: Self-pay | Admitting: Nurse Practitioner

## 2021-02-27 DIAGNOSIS — E039 Hypothyroidism, unspecified: Secondary | ICD-10-CM

## 2021-03-21 ENCOUNTER — Ambulatory Visit (INDEPENDENT_AMBULATORY_CARE_PROVIDER_SITE_OTHER): Payer: Medicare PPO

## 2021-03-21 ENCOUNTER — Ambulatory Visit: Payer: Medicare PPO | Admitting: Nurse Practitioner

## 2021-03-21 ENCOUNTER — Other Ambulatory Visit: Payer: Self-pay | Admitting: Nurse Practitioner

## 2021-03-21 ENCOUNTER — Encounter: Payer: Self-pay | Admitting: Nurse Practitioner

## 2021-03-21 ENCOUNTER — Other Ambulatory Visit: Payer: Self-pay

## 2021-03-21 VITALS — BP 116/60 | HR 70 | Temp 98.1°F | Ht 59.5 in | Wt 112.6 lb

## 2021-03-21 DIAGNOSIS — M1612 Unilateral primary osteoarthritis, left hip: Secondary | ICD-10-CM | POA: Diagnosis not present

## 2021-03-21 DIAGNOSIS — M545 Low back pain, unspecified: Secondary | ICD-10-CM

## 2021-03-21 DIAGNOSIS — M2550 Pain in unspecified joint: Secondary | ICD-10-CM | POA: Diagnosis not present

## 2021-03-21 DIAGNOSIS — M2578 Osteophyte, vertebrae: Secondary | ICD-10-CM | POA: Diagnosis not present

## 2021-03-21 DIAGNOSIS — M25552 Pain in left hip: Secondary | ICD-10-CM | POA: Diagnosis not present

## 2021-03-21 DIAGNOSIS — M160 Bilateral primary osteoarthritis of hip: Secondary | ICD-10-CM

## 2021-03-21 DIAGNOSIS — E039 Hypothyroidism, unspecified: Secondary | ICD-10-CM

## 2021-03-21 DIAGNOSIS — I878 Other specified disorders of veins: Secondary | ICD-10-CM | POA: Diagnosis not present

## 2021-03-21 DIAGNOSIS — G8929 Other chronic pain: Secondary | ICD-10-CM | POA: Diagnosis not present

## 2021-03-21 DIAGNOSIS — M48061 Spinal stenosis, lumbar region without neurogenic claudication: Secondary | ICD-10-CM | POA: Diagnosis not present

## 2021-03-21 HISTORY — DX: Bilateral primary osteoarthritis of hip: M16.0

## 2021-03-21 LAB — BASIC METABOLIC PANEL
BUN: 13 mg/dL (ref 6–23)
CO2: 26 mEq/L (ref 19–32)
Calcium: 9.5 mg/dL (ref 8.4–10.5)
Chloride: 105 mEq/L (ref 96–112)
Creatinine, Ser: 0.77 mg/dL (ref 0.40–1.20)
GFR: 76.11 mL/min (ref >60.00)
Glucose, Bld: 85 mg/dL (ref 70–99)
Potassium: 4.1 mEq/L (ref 3.5–5.1)
Sodium: 140 mEq/L (ref 135–145)

## 2021-03-21 LAB — TSH: TSH: 1.73 u[IU]/mL (ref 0.35–5.50)

## 2021-03-21 LAB — T4, FREE: Free T4: 0.93 ng/dL (ref 0.60–1.60)

## 2021-03-21 MED ORDER — GABAPENTIN 300 MG PO CAPS: ORAL_CAPSULE | ORAL | 5 refills | Status: DC

## 2021-03-21 MED ORDER — LEVOTHYROXINE SODIUM 50 MCG PO TABS: ORAL_TABLET | ORAL | 1 refills | Status: DC

## 2021-03-21 NOTE — Patient Instructions (Signed)
Go to lab for blood draw and hip x-ray  Increase gabapentin dose to 1cap in Am And 2caps in PM Also add tylenol 650mg  every 8hrs for pain Avoid taking naproxen while on meloxicam due to risk of renal injury.  Hip Pain The hip is the joint between the upper legs and the lower pelvis. The bones, cartilage, tendons, and muscles of your hip joint support your body and allow you to move around. Hip pain can range from a minor ache to severe pain in one or both of your hips. The pain may be felt on the inside of the hip joint near the groin, or on the outside near the buttocks and upper thigh. You may also have swelling or stiffness in your hip area. Follow these instructions at home: Managing pain, stiffness, and swelling   If directed, put ice on the painful area. To do this: Put ice in a plastic bag. Place a towel between your skin and the bag. Leave the ice on for 20 minutes, 2-3 times a day. If directed, apply heat to the affected area as often as told by your health care provider. Use the heat source that your health care provider recommends, such as a moist heat pack or a heating pad. Place a towel between your skin and the heat source. Leave the heat on for 20-30 minutes. Remove the heat if your skin turns bright red. This is especially important if you are unable to feel pain, heat, or cold. You may have a greater risk of getting burned. Activity Do exercises as told by your health care provider. Avoid activities that cause pain. General instructions  Take over-the-counter and prescription medicines only as told by your health care provider. Keep a journal of your symptoms. Write down: How often you have hip pain. The location of your pain. What the pain feels like. What makes the pain worse. Sleep with a pillow between your legs on your most comfortable side. Keep all follow-up visits as told by your health care provider. This is important. Contact a health care provider if: You  cannot put weight on your leg. Your pain or swelling continues or gets worse after one week. It gets harder to walk. You have a fever. Get help right away if: You fall. You have a sudden increase in pain and swelling in your hip. Your hip is red or swollen or very tender to touch. Summary Hip pain can range from a minor ache to severe pain in one or both of your hips. The pain may be felt on the inside of the hip joint near the groin, or on the outside near the buttocks and upper thigh. Avoid activities that cause pain. Write down how often you have hip pain, the location of the pain, what makes it worse, and what it feels like. This information is not intended to replace advice given to you by your health care provider. Make sure you discuss any questions you have with your health care provider. Document Revised: 07/06/2018 Document Reviewed: 07/06/2018 Elsevier Patient Education  2022 2023.

## 2021-03-21 NOTE — Assessment & Plan Note (Signed)
Worsening hip pain and limited ROM. Denies any fall. minimal improvement with naproxen and mobic.  DG hip and pelvis completed today:Severe left and moderate right femoroacetabular osteoarthritis. Entered referral to ortho Increase gabapentin dose to 1cap in Am And 2caps in PM Also add tylenol 650mg  every 8hrs for pain Maintain mobic dose Advised to Avoid taking naproxen while on meloxicam due to risk of renal injury.

## 2021-03-21 NOTE — Assessment & Plan Note (Addendum)
Repeat TSH and T4: stable Maintain current levothyroxine dose

## 2021-03-21 NOTE — Progress Notes (Signed)
Subjective:  Patient ID: Lindsay Ferguson, female    DOB: [birth year]  Age: 76 y.o. MRN: [id number]  CC: Follow-up (6 month f/u on cholesterol and thyroid. )  Primary osteoarthritis of both hips Worsening hip pain and limited ROM. Denies any fall. minimal improvement with naproxen and mobic.  DG hip and pelvis completed today:Severe left and moderate right femoroacetabular osteoarthritis. Entered referral to ortho Increase gabapentin dose to 1cap in Am And 2caps in PM Also add tylenol 650mg  every 8hrs for pain Maintain mobic dose Advised to Avoid taking naproxen while on meloxicam due to risk of renal injury.  Hypothyroidism, unspecified Repeat TSH and T4: stable Maintain current levothyroxine dose   Reviewed past Medical, Social and Family history today.  Outpatient Medications Prior to Visit  Medication Sig Dispense Refill   acyclovir (ZOVIRAX) 400 MG tablet Take 1 tablet (400 mg total) by mouth 4 (four) times daily as needed. 12 tablet 1   Aspirin Buf,CaCarb-MgCarb-MgO, 81 MG TABS Take 81 mg by mouth daily.     meloxicam (MOBIC) 7.5 MG tablet TAKE 1 TABLET(7.5 MG) BY MOUTH DAILY WITH FOOD 90 tablet 3   simvastatin (ZOCOR) 20 MG tablet Take 1 tablet (20 mg total) by mouth at bedtime. 90 tablet 3   gabapentin (NEURONTIN) 300 MG capsule TAKE 1 CAPSULE(300 MG) BY MOUTH TWICE DAILY 60 capsule 1   levothyroxine (SYNTHROID) 50 MCG tablet TAKE 1 TABLET(50 MCG) BY MOUTH DAILY BEFORE AND BREAKFAST 30 tablet 0   No facility-administered medications prior to visit.    ROS See HPI  Objective:  BP 116/60 (BP Location: Left Arm, Patient Position: Sitting, Cuff Size: Normal)    Pulse 70    Temp 98.1 F (36.7 C) (Temporal)    Ht 4' 11.5" (1.511 m)    Wt 112 lb 9.6 oz (51.1 kg)    SpO2 97%    BMI 22.36 kg/m   Physical Exam Cardiovascular:     Rate and Rhythm: Normal rate and regular rhythm.     Pulses: Normal pulses.     Heart sounds: Normal heart sounds.  Pulmonary:      Effort: Pulmonary effort is normal.     Breath sounds: Normal breath sounds.  Musculoskeletal:        General: Tenderness present. No swelling.     Right hip: Normal.     Left hip: Tenderness present. No crepitus. Decreased range of motion. Decreased strength.     Left upper leg: Normal.     Left knee: Normal.     Right lower leg: No edema.     Left lower leg: Normal. No edema.  Neurological:     Mental Status: She is alert.    Assessment & Plan:  This visit occurred during the SARS-CoV-2 public health emergency.  Safety protocols were in place, including screening questions prior to the visit, additional usage of staff PPE, and extensive cleaning of exam room while observing appropriate contact time as indicated for disinfecting solutions.   Danae was seen today for follow-up.  Diagnoses and all orders for this visit:  Acute hip pain, left -     Cancel: DG Hip Unilat W OR W/O Pelvis Min 4 Views Left; Future -     Basic metabolic panel -     AMB referral to orthopedics  Hypothyroidism, unspecified type -     TSH -     T4, free -     levothyroxine (SYNTHROID) 50 MCG tablet; TAKE 1 TABLET(50 MCG) BY  MOUTH DAILY BEFORE AND BREAKFAST  Chronic pain of multiple joints -     gabapentin (NEURONTIN) 300 MG capsule; 1cap in AM and 2caps in PM  Chronic midline low back pain without sciatica -     gabapentin (NEURONTIN) 300 MG capsule; 1cap in AM and 2caps in PM  Primary osteoarthritis of both hips -     AMB referral to orthopedics   Problem List Items Addressed This Visit       Endocrine   Hypothyroidism, unspecified    Repeat TSH and T4: stable Maintain current levothyroxine dose      Relevant Medications   levothyroxine (SYNTHROID) 50 MCG tablet   Other Relevant Orders   TSH (Completed)   T4, free (Completed)     Musculoskeletal and Integument   Primary osteoarthritis of both hips    Worsening hip pain and limited ROM. Denies any fall. minimal improvement with  naproxen and mobic.  DG hip and pelvis completed today:Severe left and moderate right femoroacetabular osteoarthritis. Entered referral to ortho Increase gabapentin dose to 1cap in Am And 2caps in PM Also add tylenol 650mg  every 8hrs for pain Maintain mobic dose Advised to Avoid taking naproxen while on meloxicam due to risk of renal injury.      Relevant Orders   AMB referral to orthopedics     Other   Chronic midline low back pain without sciatica   Relevant Medications   gabapentin (NEURONTIN) 300 MG capsule   Chronic pain of multiple joints   Relevant Medications   gabapentin (NEURONTIN) 300 MG capsule   Other Visit Diagnoses     Acute hip pain, left    -  Primary   Relevant Orders   Basic metabolic panel (Completed)   AMB referral to orthopedics       Follow-up: Return in about 6 months (around 09/18/2021) for CPE (fasting) and AWV with wellness coach.  Wilfred Lacy, NP

## 2021-03-26 ENCOUNTER — Ambulatory Visit: Payer: Medicare PPO | Admitting: Orthopaedic Surgery

## 2021-04-04 ENCOUNTER — Other Ambulatory Visit: Payer: Self-pay

## 2021-04-04 ENCOUNTER — Ambulatory Visit (INDEPENDENT_AMBULATORY_CARE_PROVIDER_SITE_OTHER): Payer: Medicare PPO | Admitting: Orthopaedic Surgery

## 2021-04-04 VITALS — Ht 59.5 in | Wt 112.6 lb

## 2021-04-04 DIAGNOSIS — M1612 Unilateral primary osteoarthritis, left hip: Secondary | ICD-10-CM

## 2021-04-04 DIAGNOSIS — G8929 Other chronic pain: Secondary | ICD-10-CM | POA: Diagnosis not present

## 2021-04-04 DIAGNOSIS — M25552 Pain in left hip: Secondary | ICD-10-CM | POA: Diagnosis not present

## 2021-04-04 HISTORY — DX: Unilateral primary osteoarthritis, left hip: M16.12

## 2021-04-04 NOTE — Progress Notes (Signed)
Office Visit Note   Patient: Lindsay Ferguson           Date of Birth: [birth year]           MRN: [id number] Visit Date: 04/04/2021              Requested by: Anne Ng, NP [address] [place],  [place] [address] PCP: Anne Ng, NP   Assessment & Plan: Visit Diagnoses:  1. Chronic left hip pain   2. Unilateral primary osteoarthritis, left hip     Plan: The patient does have quite severe arthritis of her left hip and this correlates with her clinical exam findings and x-ray findings.  We did discuss hip replacement surgery in detail.  I talked about the interoperative and postoperative course and went over her x-rays with her.  I discussed the risks and benefits of surgery.  At this point given the severity of her arthritis, with her pain we are recommending a hip replacement.  I gave her handout about this as well.  All questions and concerns were answered and addressed.  We will work on getting this scheduled hopefully in the near future.  Follow-Up Instructions: Return for 2 weeks post-op.   Orders:  No orders of the defined types were placed in this encounter.  No orders of the defined types were placed in this encounter.     Procedures: No procedures performed   Clinical Data: No additional findings.   Subjective: Chief Complaint  Patient presents with   Left Hip - Pain   Right Hip - Pain  The patient is sometimes seen for the first time.  She is a 76 year old female who is incredibly active and thin.  She reports debilitating pain with her left hip.  She has had recent x-rays showing moderate to severe left hip osteoarthritis.  It does hurt in the groin and it radiates to her knee.  She likes to walk quite a bit and she is very stiff with walking when she first gets up and has severe pain and she is able to walk some of this off but is getting significant worse over the last several months to about a year now.  At this point she has  tried and failed all forms of conservative treatment.  Her pain is 10 out of 10 with her left hip and it is daily.  It is definitely affecting her mobility, her quality of life, and her actives daily living.  HPI  Review of Systems There is currently listed no headache, chest pain, shortness of breath, fever, chills, nausea, vomiting  Objective: Vital Signs: Ht 4' 11.5" (1.511 m)    Wt 112 lb 9.6 oz (51.1 kg)    BMI 22.36 kg/m   Physical Exam She is alert and orient x3 and in no acute distress Ortho Exam Examination of her right hip is normal.  Examination of her left hip shows severe pain with internal and external rotation as well as stiffness.  The pain is in the groin. Specialty Comments:  No specialty comments available.  Imaging: No results found. An AP pelvis and lateral of the left hip are reviewed on the canopy system.  It does show severe arthritis of the left hip with joint space narrowing, sclerotic changes and periarticular osteophytes  PMFS History: Patient Active Problem List   Diagnosis Date Noted   Unilateral primary osteoarthritis, left hip 04/04/2021   Primary osteoarthritis of both hips 03/21/2021   Chronic pain  of multiple joints 12/27/2019   Chronic midline low back pain without sciatica 12/27/2019   Chronic idiopathic constipation 12/27/2019   Hypothyroidism, unspecified 11/29/2016   Status post arthroscopic surgery of right knee 12/02/2013   Primary osteoarthritis of right knee 10/12/2013   Hyperlipidemia 09/09/2013   Heart chamber dysfunction 09/09/2013   Osteopenia 02/21/2011   Past Medical History:  Diagnosis Date   Herpesviral infection, unspecified 11/29/2016   Hyperlipidemia    Hypothyroidism    Tear of lateral cartilage or meniscus of knee, current 10/12/2013    No family history on file.  Past Surgical History:  Procedure Laterality Date   ABDOMINAL HYSTERECTOMY     FACIAL FRACTURE SURGERY     SPINE SURGERY     Social History    Occupational History   Not on file  Tobacco Use   Smoking status: Never   Smokeless tobacco: Never  Vaping Use   Vaping Use: Never used  Substance and Sexual Activity   Alcohol use: Yes    Comment: Occasionally (wine)   Drug use: Never   Sexual activity: Not Currently

## 2021-04-20 ENCOUNTER — Other Ambulatory Visit: Payer: Self-pay

## 2021-04-25 ENCOUNTER — Ambulatory Visit: Payer: Medicare PPO | Admitting: Orthopaedic Surgery

## 2021-04-26 ENCOUNTER — Other Ambulatory Visit: Payer: Self-pay

## 2021-04-26 ENCOUNTER — Ambulatory Visit: Payer: Medicare PPO | Admitting: Family Medicine

## 2021-04-26 VITALS — BP 130/74 | HR 88 | Temp 97.6°F | Ht 59.5 in | Wt 113.6 lb

## 2021-04-26 DIAGNOSIS — N3000 Acute cystitis without hematuria: Secondary | ICD-10-CM

## 2021-04-26 LAB — POCT URINALYSIS DIPSTICK
Bilirubin, UA: NEGATIVE
Glucose, UA: NEGATIVE
Ketones, UA: NEGATIVE
Nitrite, UA: NEGATIVE
Protein, UA: NEGATIVE
Spec Grav, UA: <=1.005 — AB (ref 1.010–1.025)
Urobilinogen, UA: 2 E.U./dL — AB
pH, UA: 7 (ref 5.0–8.0)

## 2021-04-26 MED ORDER — SULFAMETHOXAZOLE-TRIMETHOPRIM 800-160 MG PO TABS: 1.0000 | ORAL_TABLET | Freq: Two times a day (BID) | ORAL | 0 refills | Status: DC

## 2021-04-26 NOTE — Progress Notes (Signed)
[facility] [address] [place] [address] Dept: [phone] Dept Fax: [phone]  Office Visit  Subjective:    Patient ID: Lindsay Ferguson, female    DOB: [birth year], 76 y.o..   MRN: [id number]  Chief Complaint  Patient presents with   Acute Visit    C/o having urine frequency x 1 week. Has been drinking cranberry juice.      History of Present Illness:  Patient is in today complaining of increased urinary frequency for the past week. She notes she normally urinates once a day. Now she is urinating every hour with only a small volume to her urine. She denies any dysuria. She notes her bladder feels "heavy" after she urinates. she denies fever or low back pain.  She notes it has been many years since she had a UTI. She is pushing fluids and cranberry juice.  Past Medical History: Patient Active Problem List   Diagnosis Date Noted   Unilateral primary osteoarthritis, left hip 04/04/2021   Primary osteoarthritis of both hips 03/21/2021   Chronic pain of multiple joints 12/27/2019   Chronic midline low back pain without sciatica 12/27/2019   Chronic idiopathic constipation 12/27/2019   Hypothyroidism, unspecified 11/29/2016   Status post arthroscopic surgery of right knee 12/02/2013   Primary osteoarthritis of right knee 10/12/2013   Hyperlipidemia 09/09/2013   Heart chamber dysfunction 09/09/2013   Osteopenia 02/21/2011   Past Surgical History:  Procedure Laterality Date   ABDOMINAL HYSTERECTOMY     FACIAL FRACTURE SURGERY     SPINE SURGERY     No family history on file.  Outpatient Medications Prior to Visit  Medication Sig Dispense Refill   acyclovir (ZOVIRAX) 400 MG tablet Take 1 tablet (400 mg total) by mouth 4 (four) times daily as needed. 12 tablet 1   Aspirin Buf,CaCarb-MgCarb-MgO, 81 MG TABS Take 81 mg by mouth daily.     gabapentin (NEURONTIN) 300 MG capsule 1cap in AM and 2caps in PM 90 capsule  5   levothyroxine (SYNTHROID) 50 MCG tablet TAKE 1 TABLET(50 MCG) BY MOUTH DAILY BEFORE AND BREAKFAST 90 tablet 1   meloxicam (MOBIC) 7.5 MG tablet TAKE 1 TABLET(7.5 MG) BY MOUTH DAILY WITH FOOD 90 tablet 3   simvastatin (ZOCOR) 20 MG tablet Take 1 tablet (20 mg total) by mouth at bedtime. 90 tablet 3   No facility-administered medications prior to visit.   Allergies  Allergen Reactions   Prednisone Other (See Comments)    Other reaction(s): Other (See Comments) Raised blood pressure Elevated BP Raised blood pressure Raised blood pressure    Codeine Itching    Objective:   Today's Vitals   04/26/21 0830  BP: 130/74  Pulse: 88  Temp: 97.6 F (36.4 C)  TempSrc: Temporal  SpO2: 98%  Weight: 113 lb 9.6 oz (51.5 kg)  Height: 4' 11.5" (1.511 m)   Body mass index is 22.56 kg/m.   General: Well developed, well nourished. No acute distress. Psych: Alert and oriented. Normal mood and affect.  Health Maintenance Due  Topic Date Due   TETANUS/TDAP  Never done   Zoster Vaccines- Shingrix (1 of 2) Never done   Lab Results Urine dipstick shows negative for nitrites, glucose, protein, ketones, urobilinogen, positive for leukocytes, red blood cells.  Assessment & Plan:   1. Acute cystitis without hematuria History and UA consistent with acute cystitis. I will prescribe a 3-day course of Septra. She should continue to push fluids and drink cranberry  juice.  - POCT Urinalysis Dipstick - sulfamethoxazole-trimethoprim (BACTRIM DS) 800-160 MG tablet; Take 1 tablet by mouth 2 (two) times daily.  Dispense: 6 tablet; Refill: 0 - Urine Culture   Return if symptoms worsen or fail to improve.    Haydee Salter, MD

## 2021-04-26 NOTE — Patient Instructions (Signed)
Urinary Tract Infection, Adult A urinary tract infection (UTI) is an infection of any part of the urinary tract. The urinary tract includes the kidneys, ureters, bladder, and urethra. These organs make, store, and get rid of urine in the body. An upper UTI affects the ureters and kidneys. A lower UTI affects the bladder and urethra. What are the causes? Most urinary tract infections are caused by bacteria in your genital area around your urethra, where urine leaves your body. These bacteria grow and cause inflammation of your urinary tract. What increases the risk? You are more likely to develop this condition if: You have a urinary catheter that stays in place. You are not able to control when you urinate or have a bowel movement (incontinence). You are female and you: Use a spermicide or diaphragm for birth control. Have low estrogen levels. Are pregnant. You have certain genes that increase your risk. You are sexually active. You take antibiotic medicines. You have a condition that causes your flow of urine to slow down, such as: An enlarged prostate, if you are female. Blockage in your urethra. A kidney stone. A nerve condition that affects your bladder control (neurogenic bladder). Not getting enough to drink, or not urinating often. You have certain medical conditions, such as: Diabetes. A weak disease-fighting system (immunesystem). Sickle cell disease. Gout. Spinal cord injury. What are the signs or symptoms? Symptoms of this condition include: Needing to urinate right away (urgency). Frequent urination. This may include small amounts of urine each time you urinate. Pain or burning with urination. Blood in the urine. Urine that smells bad or unusual. Trouble urinating. Cloudy urine. Vaginal discharge, if you are female. Pain in the abdomen or the lower back. You may also have: Vomiting or a decreased appetite. Confusion. Irritability or tiredness. A fever or  chills. Diarrhea. The first symptom in older adults may be confusion. In some cases, they may not have any symptoms until the infection has worsened. How is this diagnosed? This condition is diagnosed based on your medical history and a physical exam. You may also have other tests, including: Urine tests. Blood tests. Tests for STIs (sexually transmitted infections). If you have had more than one UTI, a cystoscopy or imaging studies may be done to determine the cause of the infections. How is this treated? Treatment for this condition includes: Antibiotic medicine. Over-the-counter medicines to treat discomfort. Drinking enough water to stay hydrated. If you have frequent infections or have other conditions such as a kidney stone, you may need to see a health care provider who specializes in the urinary tract (urologist). In rare cases, urinary tract infections can cause sepsis. Sepsis is a life-threatening condition that occurs when the body responds to an infection. Sepsis is treated in the hospital with IV antibiotics, fluids, and other medicines. Follow these instructions at home: Medicines Take over-the-counter and prescription medicines only as told by your health care provider. If you were prescribed an antibiotic medicine, take it as told by your health care provider. Do not stop using the antibiotic even if you start to feel better. General instructions Make sure you: Empty your bladder often and completely. Do not hold urine for long periods of time. Empty your bladder after sex. Wipe from front to back after urinating or having a bowel movement if you are female. Use each tissue only one time when you wipe. Drink enough fluid to keep your urine pale yellow. Keep all follow-up visits. This is important. Contact a health care provider   if: Your symptoms do not get better after 1-2 days. Your symptoms go away and then return. Get help right away if: You have severe pain in your  back or your lower abdomen. You have a fever or chills. You have nausea or vomiting. Summary A urinary tract infection (UTI) is an infection of any part of the urinary tract, which includes the kidneys, ureters, bladder, and urethra. Most urinary tract infections are caused by bacteria in your genital area. Treatment for this condition often includes antibiotic medicines. If you were prescribed an antibiotic medicine, take it as told by your health care provider. Do not stop using the antibiotic even if you start to feel better. Keep all follow-up visits. This is important. This information is not intended to replace advice given to you by your health care provider. Make sure you discuss any questions you have with your health care provider. Document Revised: 10/01/2019 Document Reviewed: 10/01/2019 Elsevier Patient Education  2022 [employer]  

## 2021-04-28 LAB — URINE CULTURE
MICRO NUMBER:: 13048700
SPECIMEN QUALITY:: ADEQUATE

## 2021-05-21 NOTE — Progress Notes (Signed)
Sent message, via epic in basket, requesting orders in epic from surgeon.  

## 2021-05-22 ENCOUNTER — Other Ambulatory Visit: Payer: Self-pay | Admitting: Physician Assistant

## 2021-05-22 DIAGNOSIS — M1612 Unilateral primary osteoarthritis, left hip: Secondary | ICD-10-CM

## 2021-05-25 NOTE — Patient Instructions (Signed)
2 VISITORS  (aged 75 and older)  IS ALLOWED TO COME WITH YOU AND STAY IN THE WAITING ROOM ONLY DURING PRE OP AND PROCEDURE.   ?**NO VISITORS ARE ALLOWED IN THE SHORT STAY AREA OR RECOVERY ROOM!!** ? ?IF YOU WILL BE ADMITTED INTO THE HOSPITAL YOU ARE ALLOWED ONLY 4 SUPPORT PEOPLE DURING VISITATION HOURS ONLY (7 AM -8PM)   ?The support person(s) must pass our screening, gel in and out, and wear a mask at all times, including in the patient?s room. ?Patients must also wear a mask when staff or their support person are in the room. ?Visitors GUEST BADGE MUST BE WORN VISIBLY  ?One adult visitor may remain with you overnight and MUST be in the room by 8 P.M. ?  ? ? Your procedure is scheduled on: 06/08/21 ? ? [facility] ? ?  Report to admitting at   6:15 AM ? ? Call this number if you have problems the morning of surgery [phone] ? ? Do not eat food :After Midnight. ? ? After Midnight you may have the following liquids until __6:00____ AM DAY OF SURGERY ? ?Water ?Black Coffee (sugar ok, NO MILK/CREAM OR CREAMERS)  ?Tea (sugar ok, NO MILK/CREAM OR CREAMERS) regular and decaf                             ?Plain Jell-O (NO RED)                                           ?Fruit ices (not with fruit pulp, NO RED)                                     ?Popsicles (NO RED)                                                                  ?Juice: apple, WHITE grape, WHITE cranberry ?Sports drinks like Gatorade (NO RED) ?Clear broth(vegetable,chicken,beef) ? ?             ?Drink 2 Ensure/G2 drinks AT 10:00 PM the night before surgery.   ? ?  ?  ?The day of surgery:  ?Drink ONE (1) Pre-Surgery Clear Ensure at  5:45 AM the morning of surgery. Drink in one sitting. Do not sip.  ?This drink was given to you during your hospital  ?pre-op appointment visit. ?Nothing else to drink after completing the  ?Pre-Surgery Clear Ensure at 6:00 am ?  ?       If you have questions, please contact your surgeon?s  office. ? ?  ?  ?Oral Hygiene is also important to reduce your risk of infection.                                    ?Remember - BRUSH YOUR TEETH THE MORNING OF SURGERY WITH YOUR REGULAR TOOTHPASTE ? ? Do NOT smoke after Midnight ? ? Take these medicines the  morning of surgery with A SIP OF WATER: Gabapentin, Levothyroxine ? ? ?Bring CPAP mask and tubing day of surgery. ?                  ?           You may not have any metal on your body including hair pins, jewelry, and body piercing ? ?           Do not wear make-up, lotions, powders, perfumes/cologne, or deodorant ? ?Do not wear nail polish including gel and S&S, artificial/acrylic nails, or any other type of covering on natural nails including finger and toenails. If you have artificial nails, gel coating, etc. that needs to be removed by a nail salon please have this removed prior to surgery or surgery may need to be canceled/ delayed if the surgeon/ anesthesia feels like they are unable to be safely monitored.  ? ?Do not shave  48 hours prior to surgery.  ? ? ? Do not bring valuables to the hospital. [facility] IS NOT ?            RESPONSIBLE   FOR VALUABLES. ? ? Contacts, dentures or bridgework may not be worn into surgery. ? ? Bring small overnight bag day of surgery. ?  ? Patients discharged on the day of surgery will not be allowed to drive home.  Someone NEEDS to stay with you for the first 24 hours after anesthesia. ? ? Special Instructions: Bring a copy of your healthcare power of attorney and living will documents the day of surgery if you haven't scanned them before. ? ?            Please read over the following fact sheets you were given: IF YOU HAVE QUESTIONS ABOUT YOUR PRE-OP INSTRUCTIONS PLEASE CALL [phone] ? ?   [facility] - Preparing for Surgery ?Before surgery, you can play an important role.  Because skin is not sterile, your skin needs to be as free of germs as possible.  You can reduce the number of germs on your skin by washing  with CHG (chlorahexidine gluconate) soap before surgery.  CHG is an antiseptic cleaner which kills germs and bonds with the skin to continue killing germs even after washing. ?Please DO NOT use if you have an allergy to CHG or antibacterial soaps.  If your skin becomes reddened/irritated stop using the CHG and inform your nurse when you arrive at Short Stay. ?Do not shave (including legs and underarms) for at least 48 hours prior to the first CHG shower.   ?Please follow these instructions carefully: ? 1.  Shower with CHG Soap the night before surgery and the  morning of Surgery. ? 2.  If you choose to wash your hair, wash your hair first as usual with your  normal  shampoo. ? 3.  After you shampoo, rinse your hair and body thoroughly to remove the  shampoo.                      ?      4.  Use CHG as you would any other liquid soap.  You can apply chg directly  to the skin and wash  ?                     Gently with a scrungie or clean washcloth. ? 5.  Apply the CHG Soap to your body ONLY FROM THE NECK DOWN.  Do not use on face/ open      ?                     Wound or open sores. Avoid contact with eyes, ears mouth and genitals (private parts).  ?                     Engineering geologist,  Genitals (private parts) with your normal soap. ?            6.  Wash thoroughly, paying special attention to the area where your surgery  will be performed. ? 7.  Thoroughly rinse your body with warm water from the neck down. ? 8.  DO NOT shower/wash with your normal soap after using and rinsing off  the CHG Soap. ?               9.  Pat yourself dry with a clean towel. ?           10.  Wear clean pajamas. ?           11.  Place clean sheets on your bed the night of your first shower and do not  sleep with pets. ?Day of Surgery : ?Do not apply any lotions/deodorants the morning of surgery.  Please wear clean clothes to the hospital/surgery center. ? ?FAILURE TO FOLLOW THESE INSTRUCTIONS MAY RESULT IN THE CANCELLATION OF YOUR  SURGERY ?PATIENT SIGNATURE_________________________________ ? ?NURSE SIGNATURE__________________________________ ? ?________________________________________________________________________  ? ?Incentive Spirometer ? ?An incentive spirometer is a tool that can help keep your lungs clear and active. This tool measures how well you are filling your lungs with each breath. Taking long deep breaths may help reverse or decrease the chance of developing breathing (pulmonary) problems (especially infection) following: ?A long period of time when you are unable to move or be active. ?BEFORE THE PROCEDURE  ?If the spirometer includes an indicator to show your best effort, your nurse or respiratory therapist will set it to a desired goal. ?If possible, sit up straight or lean slightly forward. Try not to slouch. ?Hold the incentive spirometer in an upright position. ?INSTRUCTIONS FOR USE  ?Sit on the edge of your bed if possible, or sit up as far as you can in bed or on a chair. ?Hold the incentive spirometer in an upright position. ?Breathe out normally. ?Place the mouthpiece in your mouth and seal your lips tightly around it. ?Breathe in slowly and as deeply as possible, raising the piston or the ball toward the top of the column. ?Hold your breath for 3-5 seconds or for as long as possible. Allow the piston or ball to fall to the bottom of the column. ?Remove the mouthpiece from your mouth and breathe out normally. ?Rest for a few seconds and repeat Steps 1 through 7 at least 10 times every 1-2 hours when you are awake. Take your time and take a few normal breaths between deep breaths. ?The spirometer may include an indicator to show your best effort. Use the indicator as a goal to work toward during each repetition. ?After each set of 10 deep breaths, practice coughing to be sure your lungs are clear. If you have an incision (the cut made at the time of surgery), support your incision when coughing by placing a pillow or  rolled up towels firmly against it. ?Once you are able to get out of bed, walk around indoors and cough well. You may stop using the incentive  spirometer when instructed by your caregiver.  ?RISKS AND COMPLICATIONS ?Take yo

## 2021-05-28 ENCOUNTER — Encounter (HOSPITAL_COMMUNITY): Payer: Self-pay

## 2021-05-28 ENCOUNTER — Telehealth: Payer: Self-pay

## 2021-05-28 ENCOUNTER — Encounter (HOSPITAL_COMMUNITY)
Admission: RE | Admit: 2021-05-28 | Discharge: 2021-05-28 | Disposition: A | Discharge status: Home / Self Care | Payer: Medicare PPO | Source: Ambulatory Visit | Attending: Orthopaedic Surgery | Admitting: Orthopaedic Surgery

## 2021-05-28 ENCOUNTER — Other Ambulatory Visit: Payer: Self-pay

## 2021-05-28 VITALS — BP 139/73 | HR 71 | Temp 97.8°F | Resp 18 | Ht 59.0 in | Wt 110.2 lb

## 2021-05-28 DIAGNOSIS — M1612 Unilateral primary osteoarthritis, left hip: Secondary | ICD-10-CM | POA: Diagnosis not present

## 2021-05-28 DIAGNOSIS — Z01818 Encounter for other preprocedural examination: Secondary | ICD-10-CM | POA: Insufficient documentation

## 2021-05-28 LAB — CBC
HCT: 40.2 % (ref 36.0–46.0)
Hemoglobin: 13.7 g/dL (ref 12.0–15.0)
MCH: 33.8 pg (ref 26.0–34.0)
MCHC: 34.1 g/dL (ref 30.0–36.0)
MCV: 99.3 fL (ref 80.0–100.0)
Platelets: 243 10*3/uL (ref 150–400)
RBC: 4.05 MIL/uL (ref 3.87–5.11)
RDW: 13 % (ref 11.5–15.5)
WBC: 6.8 10*3/uL (ref 4.0–10.5)
nRBC: 0 % (ref 0.0–0.2)

## 2021-05-28 LAB — BASIC METABOLIC PANEL
Anion gap: 7 (ref 5–15)
BUN: 10 mg/dL (ref 8–23)
CO2: 28 mmol/L (ref 22–32)
Calcium: 9.5 mg/dL (ref 8.9–10.3)
Chloride: 103 mmol/L (ref 98–111)
Creatinine, Ser: 0.75 mg/dL (ref 0.44–1.00)
GFR, Estimated: >60 mL/min (ref >60)
Glucose, Bld: 106 mg/dL — ABNORMAL HIGH (ref 70–99)
Potassium: 4.2 mmol/L (ref 3.5–5.1)
Sodium: 138 mmol/L (ref 135–145)

## 2021-05-28 LAB — TYPE AND SCREEN
ABO/RH(D): A POS
Antibody Screen: NEGATIVE

## 2021-05-28 LAB — SURGICAL PCR SCREEN
MRSA, PCR: NEGATIVE
Staphylococcus aureus: NEGATIVE

## 2021-05-28 NOTE — Telephone Encounter (Signed)
Patient called. She is scheduled for a total hip arthroplasty on 06/08/2021 with Dr.Blackman. She has a routine dental appointment scheduled for this Wednesday. She was told to call and make sure this would be okay. Please call patient back at [phone]. ?Thanks! ? ?

## 2021-05-28 NOTE — Telephone Encounter (Signed)
Patient aware she may have her teeth cleaned  ?

## 2021-05-28 NOTE — Progress Notes (Addendum)
Anesthesia note: ? ?Bowel prep reminder:NA ? ?PCP - Alysia Penna NP Corinda Gubler ?Cardiologist -none ?Other-  ? ?Chest x-ray - no ?EKG - 05/28/21-chart ?Stress Test - no ?ECHO - no ?Cardiac Cath - no ? ?Pacemaker/ICD device last checked:NA ? ?Sleep Study - no ?CPAP -  ? ?Pt is pre diabetic-NA ?Fasting Blood Sugar -  ?Checks Blood Sugar _____ ? ?Blood Thinner:NA ?Blood Thinner Instructions: ?Aspirin Instructions: ?Last Dose: ? ?Anesthesia review: yes ? ?Patient denies shortness of breath, fever, cough and chest pain at PAT appointment ?Pt states that she is very active. No SOB with any activities. ? ?Patient verbalized understanding of instructions that were given to them at the PAT appointment. Patient was also instructed that they will need to review over the PAT instructions again at home before surgery. yes ?

## 2021-05-29 ENCOUNTER — Encounter (HOSPITAL_COMMUNITY): Payer: Self-pay

## 2021-05-29 NOTE — Progress Notes (Signed)
Anesthesia Chart Review ? ? Case: [id number] Date/Time: 06/08/21 0845  ? Procedure: LEFT TOTAL HIP ARTHROPLASTY ANTERIOR APPROACH (Left: Hip) - Needs RNFA  ? Anesthesia type: Spinal  ? Pre-op diagnosis: osteoarthritis left hip  ? Location: [facility] ROOM 10 / [facility]  ? Surgeons: Kathryne Hitch, MD  ? ?  ? ? ?DISCUSSION:75 y.o. never smoker with h/o hypothyroidism, CAD, left hip OA scheduled for above procedure 06/08/2021 with Dr. Doneen Poisson.  ? ?Seen by cardiology in 2015 due to angina sx.  Per cardio note noncardiac in nature, atypical angina.  ?Per 09/09/2013 note,  ?"Stress Test ?Regular Stress was performed showing Normal test.  ?Echocardiogram ?The patient has had an echocardiogram showing Segmental LV dysfunction LVEF 45% Mild MR" ? ?Pt reports to PAT nurse she is active with no cv sx.  ? ?Last seen by PCP 03/21/2021, stable at this visit.  ? ?Anticipate pt can proceed with planned procedure barring acute status change.   ?VS: BP 139/73   Pulse 71   Temp 36.6 ?C (Oral)   Resp 18   Ht 4\' 11"  (1.499 m)   Wt 50 kg   SpO2 100%   BMI 22.27 kg/m?  ? ?PROVIDERS: ?N[place], , NP is PCP  ? ? ?LABS: Labs reviewed: Acceptable for surgery. ?(all labs ordered are listed, but only abnormal results are displayed) ? ?Labs Reviewed  ?BASIC METABOLIC PANEL - Abnormal; Notable for the following components:  ?    Result Value  ? Glucose, Bld 106 (*)   ? All other components within normal limits  ?SURGICAL PCR SCREEN  ?CBC  ?TYPE AND SCREEN  ? ? ? ?IMAGES: ? ? ?EKG: ?05/28/2021 ?Rate 72 bpm  ?NSR ?LAD  ?Anteroseptal infarct, age undetermined  ? ?CV: ? ?Past Medical History:  ?Diagnosis Date  ? Herpesviral infection, unspecified 11/29/2016  ? Hyperlipidemia   ? Hypothyroidism   ? Myocardial infarction [facility]) 2008  ? Tear of lateral cartilage or meniscus of knee, current 10/12/2013  ? ? ?Past Surgical History:  ?Procedure Laterality Date  ? ABDOMINAL HYSTERECTOMY    ? FACIAL FRACTURE SURGERY    ? SPINE SURGERY   1988  ? ? ?MEDICATIONS: ? acetaminophen (TYLENOL) 650 MG CR tablet  ? acyclovir (ZOVIRAX) 400 MG tablet  ? Aspirin Buf,CaCarb-MgCarb-MgO, 81 MG TABS  ? gabapentin (NEURONTIN) 300 MG capsule  ? levothyroxine (SYNTHROID) 50 MCG tablet  ? meloxicam (MOBIC) 7.5 MG tablet  ? simvastatin (ZOCOR) 20 MG tablet  ? sulfamethoxazole-trimethoprim (BACTRIM DS) 800-160 MG tablet  ? ?No current facility-administered medications for this encounter.  ? ? ?12/12/2013 Ward, PA-C ?[facility] Pre-Surgical [facility] ?[phone] ? ? ? ? ? ? ?

## 2021-06-07 ENCOUNTER — Telehealth: Payer: Self-pay | Admitting: *Deleted

## 2021-06-07 NOTE — Telephone Encounter (Signed)
Ortho bundle pre-op call completed. 

## 2021-06-07 NOTE — Care Plan (Signed)
[facility] RNCM call to patient to discuss her upcoming Left Total hip arthroplasty with Dr. Magnus Ivan on 06/08/21. She is an Ortho bundle patient through [facility] and is agreeable to case management. She lives with her husband, who will be assisting at home. She will need a RW prior to discharge. This has been ordered through [employer]. She has asked if she can go home same day surgery and CM has updated Dr. Magnus Ivan of this request. CM has also updated [facility] therapy supervisor in case she needs to be seen in the PACU area prior to discharge. She has declined HHPT, but she is ok with continuing with referral to [facility].They have been updated that patient will attempt to be a same day discharge and when they call to schedule first in home visit, she may decline. They will keep referral and ask patient how she feels after surgery. Reviewed all post op care instructions. Will continue to follow for needs. ?

## 2021-06-07 NOTE — H&P (Signed)
TOTAL HIP ADMISSION H&P ? ?Patient is admitted for left total hip arthroplasty. ? ?Subjective: ? ?Chief Complaint: left hip pain ? ?HPI: Lindsay Ferguson, 76 y.o. female, has a history of pain and functional disability in the left hip(s) due to arthritis and patient has failed non-surgical conservative treatments for greater than 12 weeks to include NSAID's and/or analgesics and activity modification.  Onset of symptoms was gradual starting 1 years ago with gradually worsening course since that time.The patient noted no past surgery on the left hip(s).  Patient currently rates pain in the left hip at 10 out of 10 with activity. Patient has night pain, worsening of pain with activity and weight bearing, pain that interfers with activities of daily living, and pain with passive range of motion. Patient has evidence of subchondral cysts, subchondral sclerosis, periarticular osteophytes, and joint space narrowing by imaging studies. This condition presents safety issues increasing the risk of falls.  There is no current active infection. ? ?Patient Active Problem List  ? Diagnosis Date Noted  ? Unilateral primary osteoarthritis, left hip 04/04/2021  ? Primary osteoarthritis of both hips 03/21/2021  ? Chronic pain of multiple joints 12/27/2019  ? Chronic midline low back pain without sciatica 12/27/2019  ? Chronic idiopathic constipation 12/27/2019  ? Hypothyroidism, unspecified 11/29/2016  ? Status post arthroscopic surgery of right knee 12/02/2013  ? Primary osteoarthritis of right knee 10/12/2013  ? Hyperlipidemia 09/09/2013  ? Heart chamber dysfunction 09/09/2013  ? Osteopenia 02/21/2011  ? ?Past Medical History:  ?Diagnosis Date  ? Herpesviral infection, unspecified 11/29/2016  ? Hyperlipidemia   ? Hypothyroidism   ? Tear of lateral cartilage or meniscus of knee, current 10/12/2013  ?  ?Past Surgical History:  ?Procedure Laterality Date  ? ABDOMINAL HYSTERECTOMY    ? FACIAL FRACTURE SURGERY    ? [place]  ?  ?No current facility-administered medications for this encounter.  ? ?Current Outpatient Medications  ?Medication Sig Dispense Refill Last Dose  ? acetaminophen (TYLENOL) 650 MG CR tablet Take 1,300 mg by mouth every 8 (eight) hours as needed for pain.     ? acyclovir (ZOVIRAX) 400 MG tablet Take 1 tablet (400 mg total) by mouth 4 (four) times daily as needed. 12 tablet 1   ? Aspirin Buf,CaCarb-MgCarb-MgO, 81 MG TABS Take 81 mg by mouth daily.     ? gabapentin (NEURONTIN) 300 MG capsule 1cap in AM and 2caps in PM 90 capsule 5   ? levothyroxine (SYNTHROID) 50 MCG tablet TAKE 1 TABLET(50 MCG) BY MOUTH DAILY BEFORE AND BREAKFAST 90 tablet 1   ? meloxicam (MOBIC) 7.5 MG tablet TAKE 1 TABLET(7.5 MG) BY MOUTH DAILY WITH FOOD 90 tablet 3   ? simvastatin (ZOCOR) 20 MG tablet Take 1 tablet (20 mg total) by mouth at bedtime. 90 tablet 3   ? sulfamethoxazole-trimethoprim (BACTRIM DS) 800-160 MG tablet Take 1 tablet by mouth 2 (two) times daily. (Patient not taking: Reported on 05/23/2021) 6 tablet 0 Not Taking  ? ?Allergies  ?Allergen Reactions  ? Prednisone Other (See Comments)  ?  Raised blood pressure ?Elevated BP ? ?  ? Codeine Itching  ?  ?Social History  ? ?Tobacco Use  ? Smoking status: Never  ? Smokeless tobacco: Never  ?Substance Use Topics  ? Alcohol use: Yes  ?  Alcohol/week: 7.0 standard drinks  ?  Types: 7 Standard drinks or equivalent per week  ?  Comment: Occasionally (wine)  ?  ?No family history on file.  ? ?  Review of Systems  ?All other systems reviewed and are negative. ? ?Objective: ? ?Physical Exam ?Vitals reviewed.  ?Constitutional:   ?   Appearance: Normal appearance.  ?HENT:  ?   Head: Normocephalic and atraumatic.  ?Eyes:  ?   Extraocular Movements: Extraocular movements intact.  ?   Pupils: Pupils are equal, round, and reactive to light.  ?Cardiovascular:  ?   Rate and Rhythm: Normal rate and regular rhythm.  ?Pulmonary:  ?   Effort: Pulmonary effort is normal.  ?   Breath sounds: Normal  breath sounds.  ?Abdominal:  ?   Palpations: Abdomen is soft.  ?Musculoskeletal:  ?   Cervical back: Normal range of motion and neck supple.  ?   Left hip: Tenderness and bony tenderness present. Decreased range of motion. Decreased strength.  ?Neurological:  ?   Mental Status: She is oriented to person, place, and time.  ?Psychiatric:     ?   Behavior: Behavior normal.  ? ? ?Vital signs in last 24 hours: ?  ? ?Labs: ? ? ?Estimated body mass index is 22.27 kg/m? as calculated from the following: ?  Height as of 05/28/21: 4\' 11"  (1.499 m). ?  Weight as of 05/28/21: 50 kg. ? ? ?Imaging Review ?Plain radiographs demonstrate severe degenerative joint disease of the left hip(s). The bone quality appears to be good for age and reported activity level. ? ? ? ? ? ?Assessment/Plan: ? ?End stage arthritis, left hip(s) ? ?The patient history, physical examination, clinical judgement of the provider and imaging studies are consistent with end stage degenerative joint disease of the left hip(s) and total hip arthroplasty is deemed medically necessary. The treatment options including medical management, injection therapy, arthroscopy and arthroplasty were discussed at length. The risks and benefits of total hip arthroplasty were presented and reviewed. The risks due to aseptic loosening, infection, stiffness, dislocation/subluxation,  thromboembolic complications and other imponderables were discussed.  The patient acknowledged the explanation, agreed to proceed with the plan and consent was signed. Patient is being admitted for inpatient treatment for surgery, pain control, PT, OT, prophylactic antibiotics, VTE prophylaxis, progressive ambulation and ADL's and discharge planning.The patient is planning to be discharged home with home health services ? ? ? ?

## 2021-06-08 ENCOUNTER — Ambulatory Visit (HOSPITAL_COMMUNITY): Payer: Medicare PPO | Admitting: Physician Assistant

## 2021-06-08 ENCOUNTER — Ambulatory Visit (HOSPITAL_COMMUNITY): Payer: Medicare PPO

## 2021-06-08 ENCOUNTER — Ambulatory Visit (HOSPITAL_COMMUNITY)
Admission: RE | Admit: 2021-06-08 | Discharge: 2021-06-08 | Disposition: A | Discharge status: Home / Self Care | Payer: Medicare PPO | Source: Ambulatory Visit | Attending: Orthopaedic Surgery | Admitting: Orthopaedic Surgery

## 2021-06-08 ENCOUNTER — Encounter (HOSPITAL_COMMUNITY): Payer: Self-pay | Admitting: Orthopaedic Surgery

## 2021-06-08 ENCOUNTER — Ambulatory Visit (HOSPITAL_BASED_OUTPATIENT_CLINIC_OR_DEPARTMENT_OTHER): Payer: Medicare PPO | Admitting: Certified Registered"

## 2021-06-08 ENCOUNTER — Encounter (HOSPITAL_COMMUNITY)
Admission: RE | Disposition: A | Discharge status: Home / Self Care | Payer: Self-pay | Source: Ambulatory Visit | Attending: Orthopaedic Surgery

## 2021-06-08 ENCOUNTER — Other Ambulatory Visit: Payer: Self-pay

## 2021-06-08 DIAGNOSIS — Z96642 Presence of left artificial hip joint: Secondary | ICD-10-CM | POA: Diagnosis not present

## 2021-06-08 DIAGNOSIS — E039 Hypothyroidism, unspecified: Secondary | ICD-10-CM | POA: Insufficient documentation

## 2021-06-08 DIAGNOSIS — M1612 Unilateral primary osteoarthritis, left hip: Secondary | ICD-10-CM | POA: Insufficient documentation

## 2021-06-08 DIAGNOSIS — Z471 Aftercare following joint replacement surgery: Secondary | ICD-10-CM | POA: Diagnosis not present

## 2021-06-08 DIAGNOSIS — Z01818 Encounter for other preprocedural examination: Secondary | ICD-10-CM | POA: Diagnosis not present

## 2021-06-08 DIAGNOSIS — Z791 Long term (current) use of non-steroidal anti-inflammatories (NSAID): Secondary | ICD-10-CM | POA: Diagnosis not present

## 2021-06-08 DIAGNOSIS — Z79890 Hormone replacement therapy (postmenopausal): Secondary | ICD-10-CM | POA: Diagnosis not present

## 2021-06-08 HISTORY — PX: TOTAL HIP ARTHROPLASTY: SHX124

## 2021-06-08 LAB — ABO/RH: ABO/RH(D): A POS

## 2021-06-08 SURGERY — ARTHROPLASTY, HIP, TOTAL, ANTERIOR APPROACH
Anesthesia: Spinal | Site: Hip | Laterality: Left

## 2021-06-08 MED ORDER — METHOCARBAMOL 500 MG PO TABS: 500.0000 mg | ORAL_TABLET | Freq: Four times a day (QID) | ORAL | 1 refills | Status: DC

## 2021-06-08 MED ORDER — OXYCODONE HCL 5 MG PO TABS: 5.0000 mg | ORAL_TABLET | Freq: Four times a day (QID) | ORAL | 0 refills | Status: DC | PRN

## 2021-06-08 MED ORDER — PHENYLEPHRINE HCL-NACL 20-0.9 MG/250ML-% IV SOLN
INTRAVENOUS | Status: DC | PRN
  Administered 2021-06-08: 30 ug/min via INTRAVENOUS

## 2021-06-08 MED ORDER — ONDANSETRON HCL 4 MG/2ML IJ SOLN
INTRAMUSCULAR | Status: AC
  Filled 2021-06-08: qty 2

## 2021-06-08 MED ORDER — LACTATED RINGERS IV BOLUS
250.0000 mL | Freq: Once | INTRAVENOUS | Status: AC
  Administered 2021-06-08: 250 mL via INTRAVENOUS

## 2021-06-08 MED ORDER — PHENYLEPHRINE HCL (PRESSORS) 10 MG/ML IV SOLN
INTRAVENOUS | Status: AC
  Filled 2021-06-08: qty 1

## 2021-06-08 MED ORDER — CEFAZOLIN SODIUM-DEXTROSE 2-4 GM/100ML-% IV SOLN
2.0000 g | INTRAVENOUS | Status: AC
  Administered 2021-06-08: 2 g via INTRAVENOUS
  Filled 2021-06-08: qty 100

## 2021-06-08 MED ORDER — POVIDONE-IODINE 10 % EX SWAB
2.0000 "application " | Freq: Once | CUTANEOUS | Status: AC
  Administered 2021-06-08: 2 via TOPICAL

## 2021-06-08 MED ORDER — OXYCODONE HCL 5 MG PO TABS
10.0000 mg | ORAL_TABLET | ORAL | Status: DC | PRN
  Administered 2021-06-08: 10 mg via ORAL

## 2021-06-08 MED ORDER — CEFAZOLIN SODIUM-DEXTROSE 1-4 GM/50ML-% IV SOLN: 1.0000 g | Freq: Four times a day (QID) | INTRAVENOUS | Status: DC

## 2021-06-08 MED ORDER — SODIUM CHLORIDE 0.9 % IR SOLN
Status: DC | PRN
Start: 2021-06-08 — End: 2021-06-08
  Administered 2021-06-08: 1000 mL

## 2021-06-08 MED ORDER — GLYCOPYRROLATE PF 0.2 MG/ML IJ SOSY
PREFILLED_SYRINGE | INTRAMUSCULAR | Status: DC | PRN
  Administered 2021-06-08: .2 mg via INTRAVENOUS

## 2021-06-08 MED ORDER — LACTATED RINGERS IV BOLUS
500.0000 mL | Freq: Once | INTRAVENOUS | Status: AC
  Administered 2021-06-08: 500 mL via INTRAVENOUS

## 2021-06-08 MED ORDER — ASPIRIN EC 81 MG PO TBEC: 81.0000 mg | DELAYED_RELEASE_TABLET | Freq: Two times a day (BID) | ORAL | 1 refills | Status: AC

## 2021-06-08 MED ORDER — DEXAMETHASONE SODIUM PHOSPHATE 10 MG/ML IJ SOLN
INTRAMUSCULAR | Status: AC
  Filled 2021-06-08: qty 1

## 2021-06-08 MED ORDER — TRANEXAMIC ACID-NACL 1000-0.7 MG/100ML-% IV SOLN
1000.0000 mg | INTRAVENOUS | Status: AC
  Administered 2021-06-08: 1000 mg via INTRAVENOUS
  Filled 2021-06-08: qty 100

## 2021-06-08 MED ORDER — 0.9 % SODIUM CHLORIDE (POUR BTL) OPTIME
TOPICAL | Status: DC | PRN
Start: 2021-06-08 — End: 2021-06-08
  Administered 2021-06-08: 1000 mL

## 2021-06-08 MED ORDER — METHOCARBAMOL 500 MG PO TABS: 500.0000 mg | ORAL_TABLET | Freq: Four times a day (QID) | ORAL | Status: DC | PRN

## 2021-06-08 MED ORDER — HYDROMORPHONE HCL 2 MG PO TABS: 2.0000 mg | ORAL_TABLET | ORAL | Status: DC | PRN

## 2021-06-08 MED ORDER — METHOCARBAMOL 500 MG IVPB - SIMPLE MED
INTRAVENOUS | Status: AC
  Filled 2021-06-08: qty 50

## 2021-06-08 MED ORDER — OXYCODONE HCL 5 MG PO TABS
ORAL_TABLET | ORAL | Status: AC
  Filled 2021-06-08: qty 2

## 2021-06-08 MED ORDER — FENTANYL CITRATE PF 50 MCG/ML IJ SOSY: 25.0000 ug | PREFILLED_SYRINGE | INTRAMUSCULAR | Status: DC | PRN

## 2021-06-08 MED ORDER — FENTANYL CITRATE (PF) 100 MCG/2ML IJ SOLN
INTRAMUSCULAR | Status: DC | PRN
  Administered 2021-06-08: 75 ug via INTRAVENOUS
  Administered 2021-06-08: 25 ug via INTRAVENOUS

## 2021-06-08 MED ORDER — ONDANSETRON HCL 4 MG/2ML IJ SOLN: 4.0000 mg | Freq: Once | INTRAMUSCULAR | Status: DC | PRN

## 2021-06-08 MED ORDER — METHOCARBAMOL 500 MG IVPB - SIMPLE MED
500.0000 mg | Freq: Four times a day (QID) | INTRAVENOUS | Status: DC | PRN
  Administered 2021-06-08: 500 mg via INTRAVENOUS

## 2021-06-08 MED ORDER — LACTATED RINGERS IV SOLN: INTRAVENOUS | Status: DC

## 2021-06-08 MED ORDER — PROPOFOL 10 MG/ML IV BOLUS
INTRAVENOUS | Status: DC | PRN
  Administered 2021-06-08: 20 mg via INTRAVENOUS
  Administered 2021-06-08: 15 mg via INTRAVENOUS

## 2021-06-08 MED ORDER — ACETAMINOPHEN 10 MG/ML IV SOLN: 1000.0000 mg | Freq: Once | INTRAVENOUS | Status: DC | PRN

## 2021-06-08 MED ORDER — ORAL CARE MOUTH RINSE: 15.0000 mL | Freq: Once | OROMUCOSAL | Status: AC

## 2021-06-08 MED ORDER — STERILE WATER FOR IRRIGATION IR SOLN
Status: DC | PRN
Start: 2021-06-08 — End: 2021-06-08
  Administered 2021-06-08: 2000 mL

## 2021-06-08 MED ORDER — CHLORHEXIDINE GLUCONATE 0.12 % MT SOLN
15.0000 mL | Freq: Once | OROMUCOSAL | Status: AC
  Administered 2021-06-08: 15 mL via OROMUCOSAL

## 2021-06-08 MED ORDER — PROPOFOL 500 MG/50ML IV EMUL
INTRAVENOUS | Status: DC | PRN
  Administered 2021-06-08: 20 ug/kg/min via INTRAVENOUS

## 2021-06-08 MED ORDER — FENTANYL CITRATE (PF) 100 MCG/2ML IJ SOLN
INTRAMUSCULAR | Status: AC
Start: 2021-06-08 — End: ?
  Filled 2021-06-08: qty 2

## 2021-06-08 MED ORDER — BUPIVACAINE IN DEXTROSE 0.75-8.25 % IT SOLN
INTRATHECAL | Status: DC | PRN
  Administered 2021-06-08: 1.4 mL via INTRATHECAL

## 2021-06-08 MED ORDER — ONDANSETRON HCL 4 MG/2ML IJ SOLN
INTRAMUSCULAR | Status: DC | PRN
  Administered 2021-06-08: 4 mg via INTRAVENOUS

## 2021-06-08 SURGICAL SUPPLY — 45 items
BAG COUNTER SPONGE SURGICOUNT (BAG) ×2 IMPLANT
BAG ZIPLOCK 12X15 (MISCELLANEOUS) IMPLANT
BENZOIN TINCTURE PRP APPL 2/3 (GAUZE/BANDAGES/DRESSINGS) IMPLANT
BLADE SAW SGTL 18X1.27X75 (BLADE) ×2 IMPLANT
COVER PERINEAL POST (MISCELLANEOUS) ×2 IMPLANT
COVER SURGICAL LIGHT HANDLE (MISCELLANEOUS) ×2 IMPLANT
CUP ACET PINNACLE SECTR 48MM (Joint) IMPLANT
DRAPE FOOT SWITCH (DRAPES) ×2 IMPLANT
DRAPE STERI IOBAN 125X83 (DRAPES) ×2 IMPLANT
DRAPE U-SHAPE 47X51 STRL (DRAPES) ×4 IMPLANT
DRESSING AQUACEL AG SP 3.5X10 (GAUZE/BANDAGES/DRESSINGS) IMPLANT
DRSG AQUACEL AG ADV 3.5X10 (GAUZE/BANDAGES/DRESSINGS) ×2 IMPLANT
DRSG AQUACEL AG SP 3.5X10 (GAUZE/BANDAGES/DRESSINGS) ×2
DURAPREP 26ML APPLICATOR (WOUND CARE) ×2 IMPLANT
ELECT REM PT RETURN 15FT ADLT (MISCELLANEOUS) ×2 IMPLANT
FEM STEM 12/14 TAPER SZ 4 HIP (Orthopedic Implant) ×2 IMPLANT
FEMORAL STEM 12/14 TPR SZ4 HIP (Orthopedic Implant) IMPLANT
GAUZE XEROFORM 1X8 LF (GAUZE/BANDAGES/DRESSINGS) ×2 IMPLANT
GLOVE BIO SURGEON STRL SZ7.5 (GLOVE) ×2 IMPLANT
GLOVE BIOGEL PI IND STRL 8 (GLOVE) ×2 IMPLANT
GLOVE BIOGEL PI INDICATOR 8 (GLOVE)
GLOVE ECLIPSE 8.0 STRL XLNG CF (GLOVE) ×2 IMPLANT
GOWN STRL REUS W/ TWL XL LVL3 (GOWN DISPOSABLE) ×2 IMPLANT
GOWN STRL REUS W/TWL XL LVL3 (GOWN DISPOSABLE) ×2
HANDPIECE INTERPULSE COAX TIP (DISPOSABLE) ×1
HEAD FEM STD 32X+5 STRL (Hips) ×1 IMPLANT
HOLDER FOLEY CATH W/STRAP (MISCELLANEOUS) ×2 IMPLANT
KIT TURNOVER KIT A (KITS) IMPLANT
LINER ACET 32X48 (Liner) ×1 IMPLANT
PACK ANTERIOR HIP CUSTOM (KITS) ×2 IMPLANT
PINNSECTOR W/GRIP ACE CUP 48MM (Joint) ×2 IMPLANT
SET HNDPC FAN SPRY TIP SCT (DISPOSABLE) ×1 IMPLANT
SPONGE T-LAP 18X18 ~~LOC~~+RFID (SPONGE) ×4 IMPLANT
STAPLER VISISTAT 35W (STAPLE) ×1 IMPLANT
STRIP CLOSURE SKIN 1/2X4 (GAUZE/BANDAGES/DRESSINGS) IMPLANT
SUT ETHIBOND NAB CT1 #1 30IN (SUTURE) ×2 IMPLANT
SUT ETHILON 2 0 PS N (SUTURE) IMPLANT
SUT MNCRL AB 4-0 PS2 18 (SUTURE) IMPLANT
SUT VIC AB 0 CT1 36 (SUTURE) ×2 IMPLANT
SUT VIC AB 1 CT1 36 (SUTURE) ×2 IMPLANT
SUT VIC AB 2-0 CT1 27 (SUTURE) ×2
SUT VIC AB 2-0 CT1 TAPERPNT 27 (SUTURE) ×2 IMPLANT
TRAY FOLEY MTR SLVR 14FR STAT (SET/KITS/TRAYS/PACK) ×1 IMPLANT
TRAY FOLEY MTR SLVR 16FR STAT (SET/KITS/TRAYS/PACK) IMPLANT
YANKAUER SUCT BULB TIP NO VENT (SUCTIONS) ×2 IMPLANT

## 2021-06-08 NOTE — Transfer of Care (Signed)
Immediate Anesthesia Transfer of Care Note ? ?Patient: Lindsay Ferguson ? ?Procedure(s) Performed: LEFT TOTAL HIP ARTHROPLASTY ANTERIOR APPROACH (Left: Hip) ? ?Patient Location: PACU ? ?Anesthesia Type:Spinal and MAC combined with regional for post-op pain ? ?Level of Consciousness: awake, alert , oriented and patient cooperative ? ?Airway & Oxygen Therapy: Patient Spontanous Breathing ? ?Post-op Assessment: Report given to RN and Post -op Vital signs reviewed and stable ? ?Post vital signs: Reviewed and stable ? ?Last Vitals:  ?Vitals Value Taken Time  ?BP 104/66 06/08/21 1012  ?Temp    ?Pulse    ?Resp 15 06/08/21 1013  ?SpO2    ?Vitals shown include unvalidated device data. ? ?Last Pain:  ?Vitals:  ? 06/08/21 0652  ?TempSrc: Oral  ?PainSc:   ?   ? ?  ? ?Complications: No notable events documented. ?

## 2021-06-08 NOTE — Interval H&P Note (Signed)
History and Physical Interval Note: The patient understands that she is here today for a left hip replacement to treat her left hip osteoarthritis.  There has been no acute or interval change in her medical status.  Please see recent H&P.  The risks and benefits of surgery have been explained in detail and informed consent is obtained.  The left operative hip has been marked. ? ?06/08/2021 ?7:06 AM ? ?Lindsay Ferguson  has presented today for surgery, with the diagnosis of osteoarthritis left hip.  The various methods of treatment have been discussed with the patient and family. After consideration of risks, benefits and other options for treatment, the patient has consented to  Procedure(s) with comments: ?LEFT TOTAL HIP ARTHROPLASTY ANTERIOR APPROACH (Left) - Needs RNFA as a surgical intervention.  The patient's history has been reviewed, patient examined, no change in status, stable for surgery.  I have reviewed the patient's chart and labs.  Questions were answered to the patient's satisfaction.   ? ? ?Kathryne Hitch ? ? ?

## 2021-06-08 NOTE — Discharge Instructions (Addendum)

## 2021-06-08 NOTE — Evaluation (Signed)
Physical Therapy Evaluation ?Patient Details ?Name: Lindsay Ferguson ?MRN: [id number] ?DOB: 01/10/1946 ?Today's Date: 06/08/2021 ? ?History of Present Illness ? Pt s/p L THR and with hx of back surgery  ?Clinical Impression ? Pt s/p L THR and presents with decreased L LE strength/ROM and post op pain limiting functional mobility.  This date, pt up to ambulate in hall, negotiated step, and performed initial HEP with written instruction and progression reviewed. Spouse present for full session and pt eager for dc home this date. ?   ? ?Recommendations for follow up therapy are one component of a multi-disciplinary discharge planning process, led by the attending physician.  Recommendations may be updated based on patient status, additional functional criteria and insurance authorization. ? ?Follow Up Recommendations Follow physician's recommendations for discharge plan and follow up therapies ? ?  ?Assistance Recommended at Discharge Intermittent Supervision/Assistance  ?Patient can return home with the following ? A little help with walking and/or transfers;A little help with bathing/dressing/bathroom;Assistance with cooking/housework;Assist for transportation;Help with stairs or ramp for entrance ? ?  ?Equipment Recommendations Rolling walker (2 wheels) (youth level)  ?Recommendations for Other Services ?    ?  ?Functional Status Assessment Patient has had a recent decline in their functional status and demonstrates the ability to make significant improvements in function in a reasonable and predictable amount of time.  ? ?  ?Precautions / Restrictions Precautions ?Precautions: Fall ?Restrictions ?Weight Bearing Restrictions: No ?Other Position/Activity Restrictions: WBAT  ? ?  ? ?Mobility ? Bed Mobility ?Overal bed mobility: Needs Assistance ?Bed Mobility: Supine to Sit ?  ?  ?Supine to sit: Min guard ?  ?  ?General bed mobility comments: Increased time with min guard for safety ?  ? ?Transfers ?Overall transfer  level: Needs assistance ?Equipment used: Rolling walker (2 wheels) ?Transfers: Sit to/from Stand ?Sit to Stand: Min guard, Supervision ?  ?  ?  ?  ?  ?General transfer comment: Steady assist with cues for LE management and use of UES to self assist ?  ? ?Ambulation/Gait ?Ambulation/Gait assistance: Min guard, Supervision ?Gait Distance (Feet): 80 Feet ?Assistive device: Rolling walker (2 wheels) ?Gait Pattern/deviations: Step-to pattern, Step-through pattern, Shuffle, Trunk flexed ?Gait velocity: decr ?  ?  ?General Gait Details: cues for sequence, posture and position from RW ? ?Stairs ?Stairs: Yes ?Stairs assistance: Min assist ?Stair Management: No rails, Step to pattern, Forwards, With walker ?Number of Stairs: 1 ?General stair comments: cues for sequence; pt declines to attempt second time ? ?Wheelchair Mobility ?  ? ?Modified Rankin (Stroke Patients Only) ?  ? ?  ? ?Balance Overall balance assessment: Mild deficits observed, not formally tested ?  ?  ?  ?  ?  ?  ?  ?  ?  ?  ?  ?  ?  ?  ?  ?  ?  ?  ?   ? ? ? ?Pertinent Vitals/Pain Pain Assessment ?Pain Assessment: Faces ?Faces Pain Scale: Hurts little more ?Pain Location: L hip ?Pain Descriptors / Indicators: Aching, Burning, Sore ?Pain Intervention(s): Limited activity within patient's tolerance, Monitored during session, Premedicated before session  ? ? ?Home Living Family/patient expects to be discharged to:: Private residence ?Living Arrangements: Spouse/significant other ?Available Help at Discharge: Family ?Type of Home: House ?Home Access: Stairs to enter ?  ?Entrance Stairs-Number of Steps: 1 ?  ?Home Layout: One level ?Home Equipment: None ?   ?  ?Prior Function Prior Level of Function : Independent/Modified Independent ?  ?  ?  ?  ?  ?  ?  ?  ?  ? ? ?  Hand Dominance  ?   ? ?  ?Extremity/Trunk Assessment  ? Upper Extremity Assessment ?Upper Extremity Assessment: Overall WFL for tasks assessed ?  ? ?Lower Extremity Assessment ?Lower Extremity Assessment:  LLE deficits/detail ?LLE Deficits / Details: AAROM at hip to 80 flex and 15 abd ?  ? ?Cervical / Trunk Assessment ?Cervical / Trunk Assessment: Normal  ?Communication  ? Communication: No difficulties  ?Cognition Arousal/Alertness: Awake/alert ?Behavior During Therapy: [facility] for tasks assessed/performed ?Overall Cognitive Status: Within Functional Limits for tasks assessed ?  ?  ?  ?  ?  ?  ?  ?  ?  ?  ?  ?  ?  ?  ?  ?  ?  ?  ?  ? ?  ?General Comments   ? ?  ?Exercises Total Joint Exercises ?Ankle Circles/Pumps: AROM, Both, 15 reps, Supine ?Quad Sets: AROM, Both, 10 reps, Supine ?Heel Slides: AAROM, Left, 15 reps, Supine ?Hip ABduction/ADduction: AAROM, Left, 15 reps, Supine ?Long Arc Quad: AROM, Left, 5 reps, Seated  ? ?Assessment/Plan  ?  ?PT Assessment Patient needs continued PT services  ?PT Problem List Decreased strength;Decreased range of motion;Decreased activity tolerance;Decreased balance;Decreased mobility;Pain;Decreased knowledge of use of DME ? ?   ?  ?PT Treatment Interventions DME instruction;Gait training;Stair training;Functional mobility training;Therapeutic activities;Therapeutic exercise;Patient/family education   ? ?PT Goals (Current goals can be found in the Care Plan section)  ?Acute Rehab PT Goals ?Patient Stated Goal: Get back on my lawn tractor ?PT Goal Formulation: With patient ?Time For Goal Achievement: 06/15/21 ?Potential to Achieve Goals: Good ? ?  ?Frequency 7X/week ?  ? ? ?Co-evaluation   ?  ?  ?  ?  ? ? ?  ?AM-PAC PT "6 Clicks" Mobility  ?Outcome Measure Help needed turning from your back to your side while in a flat bed without using bedrails?: A Little ?Help needed moving from lying on your back to sitting on the side of a flat bed without using bedrails?: A Little ?Help needed moving to and from a bed to a chair (including a wheelchair)?: A Little ?Help needed standing up from a chair using your arms (e.g., wheelchair or bedside chair)?: A Little ?Help needed to walk in hospital  room?: A Little ?Help needed climbing 3-5 steps with a railing? : A Little ?6 Click Score: 18 ? ?  ?End of Session Equipment Utilized During Treatment: Gait belt ?Activity Tolerance: Patient tolerated treatment well ?Patient left: in chair;with call bell/phone within reach;with family/visitor present ?Nurse Communication: Mobility status ?PT Visit Diagnosis: Difficulty in walking, not elsewhere classified (R26.2) ?  ? ?Time: 0174-9449 ?PT Time Calculation (min) (ACUTE ONLY): 40 min ? ? ?Charges:   PT Evaluation ?$PT Eval Low Complexity: 1 Low ?PT Treatments ?$Gait Training: 8-22 mins ?$Therapeutic Exercise: 8-22 mins ?  ?   ? ? ?Mauro Kaufmann PT ?Acute Rehabilitation Services ?Pager [phone] ?Office [phone] ? ? ?Neveyah Garzon ?06/08/2021, 2:45 PM ? ?

## 2021-06-08 NOTE — Anesthesia Preprocedure Evaluation (Signed)
Anesthesia Evaluation  ?Patient identified by MRN, date of birth, ID band ?Patient awake ? ? ? ?Reviewed: ?Allergy & Precautions, NPO status , Patient's Chart, lab work & pertinent test results ? ?Airway ?Mallampati: I ? ? ? ? ? ? Dental ?no notable dental hx. ? ?  ?Pulmonary ?neg pulmonary ROS,  ?  ?Pulmonary exam normal ? ? ? ? ? ? ? Cardiovascular ?negative cardio ROS ?Normal cardiovascular exam ? ? ?  ?Neuro/Psych ?negative neurological ROS ? negative psych ROS  ? GI/Hepatic ?negative GI ROS, Neg liver ROS,   ?Endo/Other  ?Hypothyroidism  ? Renal/GU ?negative Renal ROS  ?negative genitourinary ?  ?Musculoskeletal ? ?(+) Arthritis , Osteoarthritis,   ? Abdominal ?Normal abdominal exam  (+)   ?Peds ? Hematology ?negative hematology ROS ?(+)   ?Anesthesia Other Findings ? ? Reproductive/Obstetrics ? ?  ? ? ? ? ? ? ? ? ? ? ? ? ? ?  ?  ? ? ? ? ? ? ? ? ?Anesthesia Physical ?Anesthesia Plan ? ?ASA: 2 ? ?Anesthesia Plan: Spinal  ? ?Post-op Pain Management:   ? ?Induction: Intravenous ? ?PONV Risk Score and Plan: Propofol infusion and TIVA ? ?Airway Management Planned: Natural Airway and Simple Face Mask ? ?Additional Equipment: None ? ?Intra-op Plan:  ? ?Post-operative Plan:  ? ?Informed Consent: I have reviewed the patients History and Physical, chart, labs and discussed the procedure including the risks, benefits and alternatives for the proposed anesthesia with the patient or authorized representative who has indicated his/her understanding and acceptance.  ? ? ? ? ? ?Plan Discussed with: CRNA ? ?Anesthesia Plan Comments:   ? ? ? ? ? ? ?Anesthesia Quick Evaluation ? ?

## 2021-06-08 NOTE — Anesthesia Postprocedure Evaluation (Signed)
Anesthesia Post Note ? ?Patient: Lindsay Ferguson ? ?Procedure(s) Performed: LEFT TOTAL HIP ARTHROPLASTY ANTERIOR APPROACH (Left: Hip) ? ?  ? ?Patient location during evaluation: PACU ?Anesthesia Type: Spinal ?Level of consciousness: awake ?Pain management: pain level controlled ?Vital Signs Assessment: post-procedure vital signs reviewed and stable ?Respiratory status: spontaneous breathing ?Cardiovascular status: stable ?Postop Assessment: no headache, no backache, spinal receding, patient able to bend at knees and no apparent nausea or vomiting ?Anesthetic complications: no ? ? ?No notable events documented. ? ?Last Vitals:  ?Vitals:  ? 06/08/21 1015 06/08/21 1030  ?BP: 108/63 103/87  ?Pulse: 83 73  ?Resp: 13 19  ?Temp: 36.5 ?C   ?SpO2: 100% 100%  ?  ?Last Pain:  ?Vitals:  ? 06/08/21 1030  ?TempSrc:   ?PainSc: 0-No pain  ? ? ?  ?  ?  ?  ?  ?  ? ?Caren Macadam ? ? ? ? ?

## 2021-06-08 NOTE — Brief Op Note (Signed)
06/08/2021 ? ?10:14 AM ? ?PATIENT:  Lindsay Ferguson  76 y.o. female ? ?PRE-OPERATIVE DIAGNOSIS:  osteoarthritis left hip ? ?POST-OPERATIVE DIAGNOSIS:  osteoarthritis left hip ? ?PROCEDURE:  Procedure(s) with comments: ?LEFT TOTAL HIP ARTHROPLASTY ANTERIOR APPROACH (Left) - Needs RNFA ? ?SURGEON:  Surgeon(s) and Role: ?   Kathryne Hitch, MD - Primary ? ?ANESTHESIA:   spinal ? ?EBL:  150 mL  ? ?COUNTS:  YES ? ?DICTATION: .Other Dictation: Dictation Number [id number] ? ?PLAN OF CARE: Discharge from PACU if stable ? ?PATIENT DISPOSITION:  PACU - hemodynamically stable. ?  ?Delay start of Pharmacological VTE agent (>24hrs) due to surgical blood loss or risk of bleeding: no ? ?

## 2021-06-08 NOTE — Anesthesia Procedure Notes (Signed)
Spinal ? ?Patient location during procedure: OR ?Start time: 06/08/2021 8:49 AM ?End time: 06/08/2021 8:54 AM ?Reason for block: surgical anesthesia ?Staffing ?Performed: resident/CRNA  ?Resident/CRNA: Cleda Daub, CRNA ?Preanesthetic Checklist ?Completed: patient identified, IV checked, site marked, risks and benefits discussed, surgical consent, monitors and equipment checked, pre-op evaluation and timeout performed ?Spinal Block ?Patient position: sitting ?Prep: DuraPrep ?Patient monitoring: heart rate, cardiac monitor, continuous pulse ox and blood pressure ?Approach: midline ?Location: L4-5 ?Injection technique: single-shot ?Needle ?Needle type: Pencan  ?Needle gauge: 24 G ?Needle length: 10 cm ?Assessment ?Sensory level: T4 ?Events: CSF return ?Additional Notes ?Expiration dates of spinal kit and duraprep checked; pt on full monitor, O2 via FM; aseptic technique; local infiltration with lidocaine; clear CSF seen prior to injection; pt tolerated well.  ? ? ? ?

## 2021-06-09 NOTE — Op Note (Signed)
NAME: Lindsay Ferguson, Lindsay P. ?MEDICAL RECORD NO: [id number] ?ACCOUNT NO: [id number] ?DATE OF BIRTH: 01-22-1946 ?FACILITY: [facility] ?LOCATION: [facility] ?PHYSICIAN: Vanita Panda. Magnus Ivan, MD ? ?Operative Report  ? ?DATE OF PROCEDURE: 06/08/2021 ? ?PREOPERATIVE DIAGNOSIS:  Primary arthritis and degenerative joint disease, left hip. ? ?POSTOPERATIVE DIAGNOSIS:  Primary arthritis and degenerative joint disease, left hip. ? ?PROCEDURE:  Left total hip arthroplasty through direct anterior approach. ? ?IMPLANTS:  DePuy sector Gription acetabular component size 48, size 32+0 polyethylene liner, size 4 ACTIS femoral component with high offset, size 32+5 metal hip ball. ? ?SURGEON:  Vanita Panda. Magnus Ivan, MD ? ?ASSISTANT:  [facility] OR staff. ? ?ANTIBIOTICS:  2 g IV Ancef. ? ?ESTIMATED BLOOD LOSS:  150 mL ? ?COMPLICATIONS:  None. ? ?INDICATIONS:  The patient is a 76 year old very active female who has debilitating arthritis actually involving both her hips. The left one hurts her the most.  At this point, she has tried and failed all forms of conservative treatment and her left hip  ?pain is daily and it is detrimentally affecting her mobility, her quality of life and activities of daily living to the point she does wish to proceed with a total hip arthroplasty and we have recommended this as well.  I did talk in length and detail  ?about the risk of acute blood loss anemia, nerve or vessel injury, fracture, infection, dislocation, DVT, implant failure, leg length differences and skin and soft tissue issues.  I talked about our goals being decreased pain, improve mobility and  ?overall improve quality of life. ? ?DESCRIPTION OF PROCEDURE:  After informed consent was obtained, appropriate left hip was marked.  She was brought to the operating room and sat up on the stretcher where spinal anesthesia was obtained.  She was laid in supine position on the stretcher.   ?Foley catheter was placed and traction boots were placed on both her  feet.  Of note, her leg lengths were equal preoperative. I then placed her supine on the Hana fracture table, the perineal post in place and both legs in line skeletal traction device  ?and no traction applied.  Her left operative hip was prepped and draped with DuraPrep and sterile drapes.  Timeout was called and she was identified as correct patient, correct left hip.  I then made an incision just inferior and posterior to the  ?anterior superior iliac spine and carried this obliquely down the leg.  We dissected down tensor fascia lata muscle.  Tensor fascia was then divided longitudinally to proceed with direct anterior approach to the hip, identified and cauterized circumflex  ?vessels and identified the hip capsule, opened up the hip capsule in L-type format finding a moderate joint effusion and significant osteophytes around the lateral femoral head and neck. I placed curved retractor in the medial and lateral femoral neck  ?and made a femoral neck cut with oscillating saw just proximal to the lesser trochanter and completed this with an osteotome.  We placed a corkscrew guide in the femoral head and removed the femoral head in its entirety and found a wide area devoid of  ?cartilage.  I then removed remnants of acetabular labrum and other debris and placed a bent Hohmann over the medial acetabular rim.  I then began reaming in a stepwise increments from a size 43 reamer going up to a size 47 reamer with all reamers placed  ?under direct visualization, the last reamer was also placed under direct fluoroscopy, so I could obtain my depth of reaming  by inclination and anteversion.  I then placed real DePuy sector Gription acetabular component size 48 and we went with a 32+0  ?neutral polyethylene liner.  Attention was then turned to the femur.  With the leg externally rotated to 120 degrees and extended and adducted, we were able to place a Mueller retractor medially and Hohman retractor behind the greater  trochanter.  We  ?released the lateral joint capsule and used a box cutting osteotome to enter femoral canal and a rongeur to lateralize. I then began broaching using the ACTIS broaching system from a size 0 going up to a size 4.  With a size 4 in place, we trialed a  ?standard offset femoral neck and a 32+1 hip ball and reduced this in the acetabulum and based on what I could see radiographically, we needed more offset and leg length.  We dislocated the hip, removed the trial components.  I placed the real femoral  ?component, which was ACTIS size 4, but with high offset and we started with a 32+5 metal hip ball, reduced this in the acetabulum and I was pleased with stability assessed radiographically and clinically.  She definitely had more offset and I increased  ?her leg lengths slightly.  Of note, again, she has disease on the other side and would be able to equal her up with replacement on the other hip. ? ?I then irrigated the soft tissue with normal saline solution.  The joint capsule was closed with interrupted #1 Ethibond suture followed by #1 Vicryl to close the tensor fascia.  0 Vicryl was used to close deep tissue and 2-0 Vicryl was used to close  ?subcutaneous tissue.  The skin was closed with staples.  An Aquacel dressing was applied.  She was taken to recovery room in stable condition with all final counts being correct and no complications noted. ? ? ? ? ? ? ?PAA ?D: 06/08/2021 10:13:02 am T: 06/08/2021 11:58:00 pm  ?JOB: [id number]/ [id number]  ?

## 2021-06-11 ENCOUNTER — Telehealth: Payer: Self-pay | Admitting: *Deleted

## 2021-06-11 ENCOUNTER — Encounter (HOSPITAL_COMMUNITY): Payer: Self-pay | Admitting: Orthopaedic Surgery

## 2021-06-11 NOTE — Telephone Encounter (Signed)
Ortho bundle D/C call completed. 

## 2021-06-20 ENCOUNTER — Ambulatory Visit (INDEPENDENT_AMBULATORY_CARE_PROVIDER_SITE_OTHER): Payer: Medicare PPO

## 2021-06-20 DIAGNOSIS — Z0000 Encounter for general adult medical examination without abnormal findings: Secondary | ICD-10-CM

## 2021-06-20 NOTE — Patient Instructions (Signed)
Lindsay Ferguson , ?Thank you for taking time to come for your Medicare Wellness Visit. I appreciate your ongoing commitment to your health goals. Please review the following plan we discussed and let me know if I can assist you in the future.  ? ?Screening recommendations/referrals: ?Colonoscopy: 07/02/2016  due 07/2021 ?Mammogram: patient will schedule  ?Bone Density: 03/09/2020 ?Recommended yearly ophthalmology/optometry visit for glaucoma screening and checkup ?Recommended yearly dental visit for hygiene and checkup ? ?Vaccinations: ?Influenza vaccine: completed  ?Pneumococcal vaccine: completed  ?Tdap vaccine: due  ?Shingles vaccine: will consider    ? ?Advanced directives: yes  ? ?Conditions/risks identified: none  ? ?Next appointment: none  ? ? ?Preventive Care 75 Years and Older, Female ?Preventive care refers to lifestyle choices and visits with your health care provider that can promote health and wellness. ?What does preventive care include? ?A yearly physical exam. This is also called an annual well check. ?Dental exams once or twice a year. ?Routine eye exams. Ask your health care provider how often you should have your eyes checked. ?Personal lifestyle choices, including: ?Daily care of your teeth and gums. ?Regular physical activity. ?Eating a healthy diet. ?Avoiding tobacco and drug use. ?Limiting alcohol use. ?Practicing safe sex. ?Taking low-dose aspirin every day. ?Taking vitamin and mineral supplements as recommended by your health care provider. ?What happens during an annual well check? ?The services and screenings done by your health care provider during your annual well check will depend on your age, overall health, lifestyle risk factors, and family history of disease. ?Counseling  ?Your health care provider may ask you questions about your: ?Alcohol use. ?Tobacco use. ?Drug use. ?Emotional well-being. ?Home and relationship well-being. ?Sexual activity. ?Eating habits. ?History of falls. ?Memory  and ability to understand (cognition). ?Work and work Astronomer. ?Reproductive health. ?Screening  ?You may have the following tests or measurements: ?Height, weight, and BMI. ?Blood pressure. ?Lipid and cholesterol levels. These may be checked every 5 years, or more frequently if you are over 75 years old. ?Skin check. ?Lung cancer screening. You may have this screening every year starting at age 75 if you have a 30-pack-year history of smoking and currently smoke or have quit within the past 15 years. ?Fecal occult blood test (FOBT) of the stool. You may have this test every year starting at age 75. ?Flexible sigmoidoscopy or colonoscopy. You may have a sigmoidoscopy every 5 years or a colonoscopy every 10 years starting at age 75. ?Hepatitis C blood test. ?Hepatitis B blood test. ?Sexually transmitted disease (STD) testing. ?Diabetes screening. This is done by checking your blood sugar (glucose) after you have not eaten for a while (fasting). You may have this done every 1-3 years. ?Bone density scan. This is done to screen for osteoporosis. You may have this done starting at age 75. ?Mammogram. This may be done every 1-2 years. Talk to your health care provider about how often you should have regular mammograms. ?Talk with your health care provider about your test results, treatment options, and if necessary, the need for more tests. ?Vaccines  ?Your health care provider may recommend certain vaccines, such as: ?Influenza vaccine. This is recommended every year. ?Tetanus, diphtheria, and acellular pertussis (Tdap, Td) vaccine. You may need a Td booster every 10 years. ?Zoster vaccine. You may need this after age 75. ?Pneumococcal 13-valent conjugate (PCV13) vaccine. One dose is recommended after age 75. ?Pneumococcal polysaccharide (PPSV23) vaccine. One dose is recommended after age 75. ?Talk to your health care provider about which screenings  and vaccines you need and how often you need them. ?This  information is not intended to replace advice given to you by your health care provider. Make sure you discuss any questions you have with your health care provider. ?Document Released: 03/17/2015 Document Revised: 11/08/2015 Document Reviewed: 12/20/2014 ?Elsevier Interactive Patient Education ? 2017 [employer]. ? ?Fall Prevention in the Home ?Falls can cause injuries. They can happen to people of all ages. There are many things you can do to make your home safe and to help prevent falls. ?What can I do on the outside of my home? ?Regularly fix the edges of walkways and driveways and fix any cracks. ?Remove anything that might make you trip as you walk through a door, such as a raised step or threshold. ?Trim any bushes or trees on the path to your home. ?Use bright outdoor lighting. ?Clear any walking paths of anything that might make someone trip, such as rocks or tools. ?Regularly check to see if handrails are loose or broken. Make sure that both sides of any steps have handrails. ?Any raised decks and porches should have guardrails on the edges. ?Have any leaves, snow, or ice cleared regularly. ?Use sand or salt on walking paths during winter. ?Clean up any spills in your garage right away. This includes oil or grease spills. ?What can I do in the bathroom? ?Use night lights. ?Install grab bars by the toilet and in the tub and shower. Do not use towel bars as grab bars. ?Use non-skid mats or decals in the tub or shower. ?If you need to sit down in the shower, use a plastic, non-slip stool. ?Keep the floor dry. Clean up any water that spills on the floor as soon as it happens. ?Remove soap buildup in the tub or shower regularly. ?Attach bath mats securely with double-sided non-slip rug tape. ?Do not have throw rugs and other things on the floor that can make you trip. ?What can I do in the bedroom? ?Use night lights. ?Make sure that you have a light by your bed that is easy to reach. ?Do not use any sheets or  blankets that are too big for your bed. They should not hang down onto the floor. ?Have a firm chair that has side arms. You can use this for support while you get dressed. ?Do not have throw rugs and other things on the floor that can make you trip. ?What can I do in the kitchen? ?Clean up any spills right away. ?Avoid walking on wet floors. ?Keep items that you use a lot in easy-to-reach places. ?If you need to reach something above you, use a strong step stool that has a grab bar. ?Keep electrical cords out of the way. ?Do not use floor polish or wax that makes floors slippery. If you must use wax, use non-skid floor wax. ?Do not have throw rugs and other things on the floor that can make you trip. ?What can I do with my stairs? ?Do not leave any items on the stairs. ?Make sure that there are handrails on both sides of the stairs and use them. Fix handrails that are broken or loose. Make sure that handrails are as long as the stairways. ?Check any carpeting to make sure that it is firmly attached to the stairs. Fix any carpet that is loose or worn. ?Avoid having throw rugs at the top or bottom of the stairs. If you do have throw rugs, attach them to the floor with carpet tape. ?  Make sure that you have a light switch at the top of the stairs and the bottom of the stairs. If you do not have them, ask someone to add them for you. ?What else can I do to help prevent falls? ?Wear shoes that: ?Do not have high heels. ?Have rubber bottoms. ?Are comfortable and fit you well. ?Are closed at the toe. Do not wear sandals. ?If you use a stepladder: ?Make sure that it is fully opened. Do not climb a closed stepladder. ?Make sure that both sides of the stepladder are locked into place. ?Ask someone to hold it for you, if possible. ?Clearly mark and make sure that you can see: ?Any grab bars or handrails. ?First and last steps. ?Where the edge of each step is. ?Use tools that help you move around (mobility aids) if they are  needed. These include: ?Canes. ?Walkers. ?Scooters. ?Crutches. ?Turn on the lights when you go into a dark area. Replace any light bulbs as soon as they burn out. ?Set up your furniture so you have a clear path.

## 2021-06-20 NOTE — Progress Notes (Signed)
? ?Subjective:  ? Lindsay Ferguson is a 76 y.o. female who presents for Medicare Annual (Subsequent) preventive examination. ? ?I connected with Lindsay Ferguson today by telephone and verified that I am speaking with the correct person using two identifiers. ?Location patient: home ?Location provider: work ?Persons participating in the virtual visit: patient, provider. ?  ?I discussed the limitations, risks, security and privacy concerns of performing an evaluation and management service by telephone and the availability of in person appointments. I also discussed with the patient that there may be a patient responsible charge related to this service. The patient expressed understanding and verbally consented to this telephonic visit.  ?  ?Interactive audio and video telecommunications were attempted between this provider and patient, however failed, due to patient having technical difficulties OR patient did not have access to video capability.  We continued and completed visit with audio only. ? ?  ?Review of Systems    ? ?Cardiac Risk Factors include: advanced age (>89men, >78 women) ? ?   ?Objective:  ?  ?Today's Vitals  ? ?There is no height or weight on file to calculate BMI. ? ? ?  06/20/2021  ? 10:44 AM 05/28/2021  ?  8:46 AM 12/27/2019  ?  3:38 PM  ?Advanced Directives  ?Does Patient Have a Medical Advance Directive? No No No  ?Would patient like information on creating a medical advance directive? No - Patient declined No - Patient declined Yes (MAU/Ambulatory/Procedural Areas - Information given)  ? ? ?Current Medications (verified) ?Outpatient Encounter Medications as of 06/20/2021  ?Medication Sig  ? acetaminophen (TYLENOL) 650 MG CR tablet Take 1,300 mg by mouth every 8 (eight) hours as needed for pain.  ? acyclovir (ZOVIRAX) 400 MG tablet Take 1 tablet (400 mg total) by mouth 4 (four) times daily as needed.  ? Aspirin Buf,CaCarb-MgCarb-MgO, 81 MG TABS Take 81 mg by mouth daily.  ? aspirin EC 81 MG  tablet Take 1 tablet (81 mg total) by mouth 2 (two) times daily. Swallow whole.  ? gabapentin (NEURONTIN) 300 MG capsule 1cap in AM and 2caps in PM  ? levothyroxine (SYNTHROID) 50 MCG tablet TAKE 1 TABLET(50 MCG) BY MOUTH DAILY BEFORE AND BREAKFAST  ? meloxicam (MOBIC) 7.5 MG tablet TAKE 1 TABLET(7.5 MG) BY MOUTH DAILY WITH FOOD  ? simvastatin (ZOCOR) 20 MG tablet Take 1 tablet (20 mg total) by mouth at bedtime.  ? methocarbamol (ROBAXIN) 500 MG tablet Take 1 tablet (500 mg total) by mouth 4 (four) times daily. (Patient not taking: Reported on 06/20/2021)  ? oxyCODONE (ROXICODONE) 5 MG immediate release tablet Take 1-2 tablets (5-10 mg total) by mouth every 6 (six) hours as needed for severe pain. (Patient not taking: Reported on 06/20/2021)  ? ?No facility-administered encounter medications on file as of 06/20/2021.  ? ? ?Allergies (verified) ?Oxycodone, Prednisone, and Codeine  ? ?History: ?Past Medical History:  ?Diagnosis Date  ? Herpesviral infection, unspecified 11/29/2016  ? Hyperlipidemia   ? Hypothyroidism   ? Tear of lateral cartilage or meniscus of knee, current 10/12/2013  ? ?Past Surgical History:  ?Procedure Laterality Date  ? ABDOMINAL HYSTERECTOMY    ? FACIAL FRACTURE SURGERY    ? SPINE SURGERY  1988  ? TOTAL HIP ARTHROPLASTY Left 06/08/2021  ? Procedure: LEFT TOTAL HIP ARTHROPLASTY ANTERIOR APPROACH;  Surgeon: Kathryne Hitch, MD;  Location: [facility];  Service: Orthopedics;  Laterality: Left;  Needs RNFA  ? ?History reviewed. No pertinent family history. ?Social History  ? ?Socioeconomic History  ?  Marital status: Married  ?  Spouse name: Not on file  ? Number of children: Not on file  ? Years of education: Not on file  ? Highest education level: Not on file  ?Occupational History  ? Not on file  ?Tobacco Use  ? Smoking status: Never  ? Smokeless tobacco: Never  ?Vaping Use  ? Vaping Use: Never used  ?Substance and Sexual Activity  ? Alcohol use: Yes  ?  Alcohol/week: 7.0 standard drinks  ?  Types:  7 Standard drinks or equivalent per week  ?  Comment: Occasionally (wine)  ? Drug use: Never  ? Sexual activity: Not Currently  ?Other Topics Concern  ? Not on file  ?Social History Narrative  ? Not on file  ? ?Social Determinants of Health  ? ?Financial Resource Strain: Low Risk   ? Difficulty of Paying Living Expenses: Not hard at all  ?Food Insecurity: No Food Insecurity  ? Worried About Programme researcher, broadcasting/film/video in the Last Year: Never true  ? Ran Out of Food in the Last Year: Never true  ?Transportation Needs: No Transportation Needs  ? Lack of Transportation (Medical): No  ? Lack of Transportation (Non-Medical): No  ?Physical Activity: Sufficiently Active  ? Days of Exercise per Week: 7 days  ? Minutes of Exercise per Session: 30 min  ?Stress: No Stress Concern Present  ? Feeling of Stress : Only a little  ?Social Connections: Moderately Isolated  ? Frequency of Communication with Friends and Family: Twice a week  ? Frequency of Social Gatherings with Friends and Family: Twice a week  ? Attends Religious Services: Never  ? Active Member of Clubs or Organizations: No  ? Attends Banker Meetings: Never  ? Marital Status: Married  ? ? ?Tobacco Counseling ?Counseling given: Not Answered ? ? ?Clinical Intake: ? ?Pre-visit preparation completed: Yes ? ?Pain : No/denies pain ? ?  ? ?Nutritional Risks: None ?Diabetes: No ? ?How often do you need to have someone help you when you read instructions, pamphlets, or other written materials from your doctor or pharmacy?: 1 - Never ?What is the last grade level you completed in school?: college ? ?Diabetic?no  ? ?Interpreter Needed?: No ? ?Information entered by :: L.Sigourney Portillo,LPN ? ? ?Activities of Daily Living ? ?  06/20/2021  ? 10:53 AM 05/28/2021  ?  8:48 AM  ?In your present state of health, do you have any difficulty performing the following activities:  ?Hearing? 0   ?Vision? 0   ?Difficulty concentrating or making decisions? 0   ?Walking or climbing stairs? 0    ?Dressing or bathing? 0   ?Doing errands, shopping? 0 0  ?Preparing Food and eating ? N   ?Using the Toilet? N   ?In the past six months, have you accidently leaked urine? N   ?Do you have problems with loss of bowel control? N   ?Managing your Medications? N   ?Managing your Finances? N   ?Housekeeping or managing your Housekeeping? N   ? ? ?Patient Care Team: ?Nche, Bonna Gains, NP as PCP - General (Internal Medicine) ? ?Indicate any recent Medical Services you may have received from other than [facility] providers in the past year (date may be approximate). ? ?   ?Assessment:  ? This is a routine wellness examination for Lindsay Ferguson. ? ?Hearing/Vision screen ?Vision Screening - Comments:: Annual eye exams wears glasses  ? ?Dietary issues and exercise activities discussed: ?Current Exercise Habits: Home exercise routine, Type of exercise:  walking;strength training/weights, Time (Minutes): 30, Frequency (Times/Week): 7, Weekly Exercise (Minutes/Week): 210, Intensity: Mild, Exercise limited by: orthopedic condition(s) ? ? Goals Addressed   ?None ?  ? ?Depression Screen ? ?  06/20/2021  ? 10:47 AM 06/20/2021  ? 10:42 AM 04/26/2021  ?  8:29 AM 03/21/2021  ?  8:22 AM 12/27/2019  ?  1:05 PM  ?PHQ 2/9 Scores  ?PHQ - 2 Score 0 0 0 1 0  ?PHQ- 9 Score    5 4  ?  ?Fall Risk ? ?  06/20/2021  ? 10:45 AM 04/26/2021  ?  8:29 AM 03/21/2021  ?  8:02 AM 08/09/2020  ?  8:58 AM 12/27/2019  ?  3:38 PM  ?Fall Risk   ?Falls in the past year? 0 0 0 0 0  ?Number falls in past yr: 0 0 0 0 0  ?Injury with Fall? 0 0 0 0 0  ?Risk for fall due to : Other (Comment) No Fall Risks No Fall Risks No Fall Risks No Fall Risks  ?Risk for fall due to: Comment recovering hip replacement      ?Follow up Falls evaluation completed;Education provided Falls evaluation completed Falls evaluation completed Falls evaluation completed Falls evaluation completed  ? ? ?FALL RISK PREVENTION PERTAINING TO THE HOME: ? ?Any stairs in or around the home? Yes  ?If so, are there any  without handrails? No  ?Home free of loose throw rugs in walkways, pet beds, electrical cords, etc? Yes  ?Adequate lighting in your home to reduce risk of falls? Yes  ? ?ASSISTIVE DEVICES UTILIZED TO PREVENT

## 2021-06-25 ENCOUNTER — Telehealth: Payer: Self-pay | Admitting: *Deleted

## 2021-06-25 ENCOUNTER — Ambulatory Visit (INDEPENDENT_AMBULATORY_CARE_PROVIDER_SITE_OTHER): Payer: Medicare PPO | Admitting: Orthopaedic Surgery

## 2021-06-25 DIAGNOSIS — Z96642 Presence of left artificial hip joint: Secondary | ICD-10-CM | POA: Insufficient documentation

## 2021-06-25 HISTORY — DX: Presence of left artificial hip joint: Z96.642

## 2021-06-25 NOTE — Progress Notes (Signed)
The patient is 2 weeks status post a left total hip arthroplasty.  She is doing well overall and has no issues.  She actually wants to get back on her riding lawnmower soon as she can.  She is only taken Tylenol for pain and has been compliant with a baby aspirin twice a day.  She does feel like there is some slight leg length difference. ? ?When I have her lay supine she is just slightly longer on the left than the right.  She notices it more because she is petite.  For now I am just going to have her try an insert in her opposite shoe just over-the-counter but I would like to see how this plays out over the next 4 weeks. ? ?She can stop her baby aspirin twice a day since she is doing so well.  She will try just over the counter insert only in her right shoe for now.  We will see her back in 4 weeks to see how she is doing overall. ?

## 2021-06-25 NOTE — Telephone Encounter (Signed)
(  Late entry for 06/15/21 call to patient) 7 day Ortho bundle call completed.  ?

## 2021-07-23 ENCOUNTER — Ambulatory Visit (INDEPENDENT_AMBULATORY_CARE_PROVIDER_SITE_OTHER): Payer: Medicare PPO | Admitting: Orthopaedic Surgery

## 2021-07-23 ENCOUNTER — Encounter: Payer: Self-pay | Admitting: Orthopaedic Surgery

## 2021-07-23 DIAGNOSIS — Z96642 Presence of left artificial hip joint: Secondary | ICD-10-CM

## 2021-07-23 NOTE — Progress Notes (Signed)
The patient is a very active and young appearing 76 year old female who is 6 weeks status post a left total hip arthroplasty.  She is walking with assistive device.  She does report stiffness when she first gets up from a sitting position and some numbness and tingling.  She has been having some clicking in her right knee and some issues with the right foot and feels like this may be just compensating dealing with her left side.  She says that she is so active that she would like to be able along farther than she is and was expecting to be along further than she has at this point.  I gave her reassurance that she is still not doing well and have a lot of people.  Her left operative hip moves smoothly and fluidly.  Her right hip also moves smoothly and fluidly.  Her right knee moves smoothly and fluidly with just some slight patellofemoral crepitation.  At this point follow-up can be in 6 months unless there are issues.  We will have a standing low AP pelvis and lateral of her left hip at that visit.  Again I gave her reassurance that she is doing well and will continue to hopefully do well the further she gets out from the surgery.

## 2021-08-29 ENCOUNTER — Telehealth: Payer: Self-pay | Admitting: *Deleted

## 2021-08-29 NOTE — Telephone Encounter (Signed)
Ortho bundle 30 day survey completed. 

## 2021-08-30 ENCOUNTER — Other Ambulatory Visit: Payer: Self-pay | Admitting: Nurse Practitioner

## 2021-08-30 DIAGNOSIS — E782 Mixed hyperlipidemia: Secondary | ICD-10-CM

## 2021-08-30 DIAGNOSIS — M545 Low back pain, unspecified: Secondary | ICD-10-CM

## 2021-08-30 DIAGNOSIS — G8929 Other chronic pain: Secondary | ICD-10-CM

## 2021-08-30 DIAGNOSIS — E039 Hypothyroidism, unspecified: Secondary | ICD-10-CM

## 2021-08-30 NOTE — Telephone Encounter (Signed)
Chart Supports Rx Last OV: 03/2021 Next OV: 09/2021 

## 2021-08-30 NOTE — Telephone Encounter (Signed)
Chart supports Rx Last OV: 03/2021 Next OV: 09/2021

## 2021-09-19 ENCOUNTER — Encounter: Payer: Medicare PPO | Admitting: Nurse Practitioner

## 2021-09-25 ENCOUNTER — Ambulatory Visit (INDEPENDENT_AMBULATORY_CARE_PROVIDER_SITE_OTHER): Payer: Medicare PPO | Admitting: Nurse Practitioner

## 2021-09-25 ENCOUNTER — Encounter: Payer: Self-pay | Admitting: Nurse Practitioner

## 2021-09-25 ENCOUNTER — Telehealth: Payer: Self-pay | Admitting: Nurse Practitioner

## 2021-09-25 VITALS — BP 116/66 | HR 50 | Temp 98.0°F | Ht 58.5 in | Wt 112.0 lb

## 2021-09-25 DIAGNOSIS — K5904 Chronic idiopathic constipation: Secondary | ICD-10-CM

## 2021-09-25 DIAGNOSIS — Z0001 Encounter for general adult medical examination with abnormal findings: Secondary | ICD-10-CM

## 2021-09-25 DIAGNOSIS — M810 Age-related osteoporosis without current pathological fracture: Secondary | ICD-10-CM

## 2021-09-25 DIAGNOSIS — G8929 Other chronic pain: Secondary | ICD-10-CM | POA: Diagnosis not present

## 2021-09-25 DIAGNOSIS — E782 Mixed hyperlipidemia: Secondary | ICD-10-CM

## 2021-09-25 DIAGNOSIS — M545 Low back pain, unspecified: Secondary | ICD-10-CM

## 2021-09-25 DIAGNOSIS — M2550 Pain in unspecified joint: Secondary | ICD-10-CM

## 2021-09-25 DIAGNOSIS — E039 Hypothyroidism, unspecified: Secondary | ICD-10-CM | POA: Diagnosis not present

## 2021-09-25 LAB — LIPID PANEL
Cholesterol: 205 mg/dL — ABNORMAL HIGH (ref 0–200)
HDL: 104.3 mg/dL
LDL Cholesterol: 81 mg/dL (ref 0–99)
NonHDL: 100.27
Total CHOL/HDL Ratio: 2
Triglycerides: 98 mg/dL (ref 0.0–149.0)
VLDL: 19.6 mg/dL (ref 0.0–40.0)

## 2021-09-25 LAB — COMPREHENSIVE METABOLIC PANEL WITH GFR
ALT: 12 U/L (ref 0–35)
AST: 24 U/L (ref 0–37)
Albumin: 4.6 g/dL (ref 3.5–5.2)
Alkaline Phosphatase: 72 U/L (ref 39–117)
BUN: 11 mg/dL (ref 6–23)
CO2: 26 meq/L (ref 19–32)
Calcium: 9.6 mg/dL (ref 8.4–10.5)
Chloride: 105 meq/L (ref 96–112)
Creatinine, Ser: 0.67 mg/dL (ref 0.40–1.20)
GFR: 85.93 mL/min
Glucose, Bld: 86 mg/dL (ref 70–99)
Potassium: 4.4 meq/L (ref 3.5–5.1)
Sodium: 142 meq/L (ref 135–145)
Total Bilirubin: 0.4 mg/dL (ref 0.2–1.2)
Total Protein: 7.1 g/dL (ref 6.0–8.3)

## 2021-09-25 LAB — T4, FREE: Free T4: 0.87 ng/dL (ref 0.60–1.60)

## 2021-09-25 LAB — TSH: TSH: 1.79 u[IU]/mL (ref 0.35–5.50)

## 2021-09-25 MED ORDER — ALENDRONATE SODIUM 70 MG PO TABS: 70.0000 mg | ORAL_TABLET | ORAL | 3 refills | Status: DC

## 2021-09-25 MED ORDER — GABAPENTIN 300 MG PO CAPS: ORAL_CAPSULE | ORAL | 5 refills | Status: DC

## 2021-09-25 NOTE — Assessment & Plan Note (Addendum)
Last dexa scan 2022: t-score -2.6 in L. Femur, -2.4 in forearm Agreed to start fosamax, calcium and Vit. D Repeat dexa scan in 1-62yrs

## 2021-09-25 NOTE — Assessment & Plan Note (Signed)
Stable with use of colace EOD No blood in stool, no ABD pain, no nause Last colonoscopy 2022 with normal report per patient. Completed by [facility] health. Requested report.

## 2021-09-25 NOTE — Patient Instructions (Addendum)
Sign medical release to get colonoscopy report from [facility]. Will order cologuard if report is normal Schedule appt for mammogram Maintain high fiber diet and adequate oral hydration to help with constipation  Go to lab

## 2021-09-25 NOTE — Assessment & Plan Note (Addendum)
Repeat TSh and T4 Maintain levothyroxine dose

## 2021-09-25 NOTE — Addendum Note (Signed)
Addended by: Michaela Corner on: 09/25/2021 02:58 PM   Modules accepted: Orders

## 2021-09-25 NOTE — Progress Notes (Addendum)
Complete physical exam  Patient: Lindsay Ferguson   DOB: 08/11/1945   76 y.o. Female  MRN: [id number] Visit Date: 09/25/2021  Subjective:    Chief Complaint  Patient presents with   Annual Exam    CPE Pt fasting No concerns today    Lindsay Ferguson is a 76 y.o. female who presents today for a complete physical exam. She reports consuming a general diet.  Walking daily  She generally feels well. She reports sleeping well. She does have additional problems to discuss today.  Vision:No Dental:No STD Screen:No  BP Readings from Last 3 Encounters:  09/25/21 116/66  06/08/21 106/84  05/28/21 139/73    Wt Readings from Last 3 Encounters:  09/25/21 112 lb (50.8 kg)  06/08/21 110 lb 3.7 oz (50 kg)  05/28/21 110 lb 4 oz (50 kg)    Most recent fall risk assessment:    06/20/2021   10:45 AM  Fall Risk   Falls in the past year? 0  Number falls in past yr: 0  Injury with Fall? 0  Risk for fall due to : Other (Comment)  Risk for fall due to: Comment recovering hip replacement  Follow up Falls evaluation completed;Education provided     Most recent depression screenings:    09/25/2021    8:48 AM 06/20/2021   10:47 AM  PHQ 2/9 Scores  PHQ - 2 Score 2 0  PHQ- 9 Score 10     HPI  Hypothyroidism, unspecified Repeat TSh and T4 Maintain levothyroxine dose  Hyperlipidemia Current use of zocor Repeat lipid panel  Osteoporosis Last dexa scan 2022: t-score -2.6 in L. Femur, -2.4 in forearm Agreed to start fosamax, calcium and Vit. D Repeat dexa scan in 1-6171yrs  Chronic idiopathic constipation Stable with use of colace EOD No blood in stool, no ABD pain, no nause Last colonoscopy 2022 with normal report per patient. [facility] Requested report.   Past Medical History:  Diagnosis Date   Herpesviral infection, unspecified 11/29/2016   Hyperlipidemia    Hypothyroidism    Tear of lateral cartilage or meniscus of knee, current 10/12/2013    Unilateral primary osteoarthritis, left hip 04/04/2021   Past Surgical History:  Procedure Laterality Date   ABDOMINAL HYSTERECTOMY     FACIAL FRACTURE SURGERY     SPINE SURGERY  1988   TOTAL HIP ARTHROPLASTY Left 06/08/2021   Procedure: LEFT TOTAL HIP ARTHROPLASTY ANTERIOR APPROACH;  Surgeon: Kathryne HitchBlackman, Christopher Y, MD;  Location: [facility];  Service: Orthopedics;  Laterality: Left;  Needs RNFA   Social History   Socioeconomic History   Marital status: Married    Spouse name: Not on file   Number of children: Not on file   Years of education: Not on file   Highest education level: Not on file  Occupational History   Not on file  Tobacco Use   Smoking status: Never   Smokeless tobacco: Never  Vaping Use   Vaping Use: Never used  Substance and Sexual Activity   Alcohol use: Yes    Alcohol/week: 7.0 standard drinks of alcohol    Types: 7 Standard drinks or equivalent per week    Comment: Occasionally (wine)   Drug use: Never   Sexual activity: Not Currently  Other Topics Concern   Not on file  Social History Narrative   Not on file   Social Determinants of Health   Financial Resource Strain: Low Risk  (06/20/2021)   Overall Financial Resource Strain (CARDIA)  Difficulty of Paying Living Expenses: Not hard at all  Food Insecurity: No Food Insecurity (06/20/2021)   Hunger Vital Sign    Worried About Running Out of Food in the Last Year: Never true    Ran Out of Food in the Last Year: Never true  Transportation Needs: No Transportation Needs (06/20/2021)   PRAPARE - Administrator, Civil Service (Medical): No    Lack of Transportation (Non-Medical): No  Physical Activity: Sufficiently Active (06/20/2021)   Exercise Vital Sign    Days of Exercise per Week: 7 days    Minutes of Exercise per Session: 30 min  Stress: No Stress Concern Present (06/20/2021)   [employer] of Occupational Health - Occupational Stress Questionnaire    Feeling of Stress : Only a  little  Social Connections: Moderately Isolated (06/20/2021)   Social Connection and Isolation Panel [NHANES]    Frequency of Communication with Friends and Family: Twice a week    Frequency of Social Gatherings with Friends and Family: Twice a week    Attends Religious Services: Never    Database administrator or Organizations: No    Attends Banker Meetings: Never    Marital Status: Married  Catering manager Violence: Not At Risk (06/20/2021)   Humiliation, Afraid, Rape, and Kick questionnaire    Fear of Current or Ex-Partner: No    Emotionally Abused: No    Physically Abused: No    Sexually Abused: No   Family Status  Relation Name Status   Mother  Deceased   Father  Deceased   Sister  Alive   Family History  Problem Relation Age of Onset   Cancer Mother    Diabetes Father    Allergies  Allergen Reactions   Oxycodone    Prednisone Other (See Comments)    Raised blood pressure Elevated BP     Codeine Itching    Patient Care Team: Galadriel Shroff, Bonna Gains, NP as PCP - General (Internal Medicine)   Medications: Outpatient Medications Prior to Visit  Medication Sig   acetaminophen (TYLENOL) 650 MG CR tablet Take 1,300 mg by mouth every 8 (eight) hours as needed for pain.   acyclovir (ZOVIRAX) 400 MG tablet Take 1 tablet (400 mg total) by mouth 4 (four) times daily as needed.   Aspirin Buf,CaCarb-MgCarb-MgO, 81 MG TABS Take 81 mg by mouth daily.   aspirin EC 81 MG tablet Take 1 tablet (81 mg total) by mouth 2 (two) times daily. Swallow whole.   calcium carbonate (OSCAL) 1500 (600 Ca) MG TABS tablet Take 1 tablet by mouth 2 (two) times daily with a meal.   cholecalciferol (VITAMIN D3) 25 MCG (1000 UNIT) tablet Take 1,000 Units by mouth daily.   levothyroxine (SYNTHROID) 50 MCG tablet TAKE 1 TABLET(50 MCG) BY MOUTH DAILY BEFORE BREAKFAST   meloxicam (MOBIC) 7.5 MG tablet TAKE 1 TABLET(7.5 MG) BY MOUTH DAILY WITH FOOD   simvastatin (ZOCOR) 20 MG tablet TAKE 1  TABLET(20 MG) BY MOUTH AT BEDTIME   [DISCONTINUED] gabapentin (NEURONTIN) 300 MG capsule 1cap in AM and 2caps in PM   [DISCONTINUED] methocarbamol (ROBAXIN) 500 MG tablet Take 1 tablet (500 mg total) by mouth 4 (four) times daily. (Patient not taking: Reported on 06/20/2021)   [DISCONTINUED] oxyCODONE (ROXICODONE) 5 MG immediate release tablet Take 1-2 tablets (5-10 mg total) by mouth every 6 (six) hours as needed for severe pain. (Patient not taking: Reported on 06/20/2021)   No facility-administered medications prior to visit.  Review of Systems  Constitutional:  Negative for fever.  HENT:  Negative for congestion and sore throat.   Eyes:        Negative for visual changes  Respiratory:  Negative for cough and shortness of breath.   Cardiovascular:  Negative for chest pain, palpitations and leg swelling.  Gastrointestinal:  Negative for blood in stool, constipation and diarrhea.  Genitourinary:  Negative for dysuria, frequency and urgency.  Musculoskeletal:  Negative for myalgias.  Skin:  Negative for rash.  Neurological:  Negative for dizziness and headaches.  Hematological:  Does not bruise/bleed easily.  Psychiatric/Behavioral:  Negative for suicidal ideas. The patient is not nervous/anxious.     Last CBC Lab Results  Component Value Date   WBC 6.8 05/28/2021   HGB 13.7 05/28/2021   HCT 40.2 05/28/2021   MCV 99.3 05/28/2021   MCH 33.8 05/28/2021   RDW 13.0 05/28/2021   PLT 243 05/28/2021   Last metabolic panel Lab Results  Component Value Date   GLUCOSE 86 09/25/2021   NA 142 09/25/2021   K 4.4 09/25/2021   CL 105 09/25/2021   CO2 26 09/25/2021   BUN 11 09/25/2021   CREATININE 0.67 09/25/2021   GFRNONAA >60 05/28/2021   CALCIUM 9.6 09/25/2021   PROT 7.1 09/25/2021   ALBUMIN 4.6 09/25/2021   BILITOT 0.4 09/25/2021   ALKPHOS 72 09/25/2021   AST 24 09/25/2021   ALT 12 09/25/2021   ANIONGAP 7 05/28/2021        Objective:  BP 116/66 (BP Location: Left Arm,  Patient Position: Sitting, Cuff Size: Small)   Pulse (!) 50   Temp 98 F (36.7 C) (Temporal)   Ht 4' 10.5" (1.486 m)   Wt 112 lb (50.8 kg)   SpO2 97%   BMI 23.01 kg/m     BP Readings from Last 3 Encounters:  09/25/21 116/66  06/08/21 106/84  05/28/21 139/73   Wt Readings from Last 3 Encounters:  09/25/21 112 lb (50.8 kg)  06/08/21 110 lb 3.7 oz (50 kg)  05/28/21 110 lb 4 oz (50 kg)   Physical Exam Vitals reviewed.  Constitutional:      General: She is not in acute distress.    Appearance: She is well-developed.  HENT:     Right Ear: Tympanic membrane, ear canal and external ear normal.     Left Ear: Tympanic membrane, ear canal and external ear normal.     Nose: Nose normal.  Eyes:     Extraocular Movements: Extraocular movements intact.     Conjunctiva/sclera: Conjunctivae normal.  Cardiovascular:     Rate and Rhythm: Normal rate and regular rhythm.     Pulses: Normal pulses.     Heart sounds: Normal heart sounds.  Pulmonary:     Effort: Pulmonary effort is normal. No respiratory distress.     Breath sounds: Normal breath sounds.  Chest:     Chest wall: No tenderness.  Abdominal:     General: Bowel sounds are normal.     Palpations: Abdomen is soft.  Musculoskeletal:        General: Normal range of motion.     Cervical back: Normal range of motion and neck supple.     Right lower leg: No edema.     Left lower leg: No edema.  Skin:    General: Skin is warm and dry.  Neurological:     Mental Status: She is alert and oriented to person, place, and time.  Deep Tendon Reflexes: Reflexes are normal and symmetric.  Psychiatric:        Mood and Affect: Mood normal.        Behavior: Behavior normal.        Thought Content: Thought content normal.     Results for orders placed or performed in visit on 09/25/21  T4, free  Result Value Ref Range   Free T4 0.87 0.60 - 1.60 ng/dL  TSH  Result Value Ref Range   TSH 1.79 0.35 - 5.50 uIU/mL  Comprehensive  metabolic panel  Result Value Ref Range   Sodium 142 135 - 145 mEq/L   Potassium 4.4 3.5 - 5.1 mEq/L   Chloride 105 96 - 112 mEq/L   CO2 26 19 - 32 mEq/L   Glucose, Bld 86 70 - 99 mg/dL   BUN 11 6 - 23 mg/dL   Creatinine, Ser 0.97 0.40 - 1.20 mg/dL   Total Bilirubin 0.4 0.2 - 1.2 mg/dL   Alkaline Phosphatase 72 39 - 117 U/L   AST 24 0 - 37 U/L   ALT 12 0 - 35 U/L   Total Protein 7.1 6.0 - 8.3 g/dL   Albumin 4.6 3.5 - 5.2 g/dL   GFR 35.32 >99.24 mL/min   Calcium 9.6 8.4 - 10.5 mg/dL  Lipid panel  Result Value Ref Range   Cholesterol 205 (H) 0 - 200 mg/dL   Triglycerides 26.8 0.0 - 149.0 mg/dL   HDL 341.96 >22.29 mg/dL   VLDL 79.8 0.0 - 92.1 mg/dL   LDL Cholesterol 81 0 - 99 mg/dL   Total CHOL/HDL Ratio 2    NonHDL 100.27       Assessment & Plan:    Routine Health Maintenance and Physical Exam  Immunization History  Administered Date(s) Administered   Influenza, High Dose Seasonal PF 12/06/2014, 11/23/2017, 12/02/2017, 11/20/2018   Influenza,inj,Quad PF,6+ Mos 12/06/2014   Influenza-Unspecified 11/29/2013, 12/06/2014, 11/26/2019, 12/11/2020   PFIZER(Purple Top)SARS-COV-2 Vaccination 05/06/2019, 06/05/2019, 01/21/2020, 07/13/2020, 12/11/2020   Pneumococcal Conjugate-13 08/11/2018   Pneumococcal Polysaccharide-23 04/24/2012, 04/24/2012, 06/19/2016, 06/19/2016   Zoster, Live 02/21/2011, 02/21/2011    Health Maintenance  Topic Date Due   MAMMOGRAM  03/09/2021   COLONOSCOPY (Pts 45-28yrs Insurance coverage will need to be confirmed)  07/02/2021   COVID-19 Vaccine (6 - Pfizer series) 10/11/2021 (Originally 02/05/2021)   Zoster Vaccines- Shingrix (1 of 2) 12/26/2021 (Originally 01/08/1997)   TETANUS/TDAP  09/26/2022 (Originally 01/08/1966)   INFLUENZA VACCINE  10/02/2021   Pneumonia Vaccine 16+ Years old  Completed   DEXA SCAN  Completed   Hepatitis C Screening  Completed   HPV VACCINES  Aged Out   Discussed health benefits of physical activity, and encouraged her to  engage in regular exercise appropriate for her age and condition. Advised to schedule appt for annual mammogram and sign medical release to get colonoscopy report from [facility].  Problem List Items Addressed This Visit       Digestive   Chronic idiopathic constipation    Stable with use of colace EOD No blood in stool, no ABD pain, no nause Last colonoscopy 2022 with normal report per patient. Completed by [facility]. Requested report.         Endocrine   Hypothyroidism, unspecified    Repeat TSh and T4 Maintain levothyroxine dose      Relevant Orders   T4, free (Completed)   TSH (Completed)     Musculoskeletal and Integument   Osteoporosis    Last dexa scan 2022:  t-score -2.6 in L. Femur, -2.4 in forearm Agreed to start fosamax, calcium and Vit. D Repeat dexa scan in 1-24yrs      Relevant Medications   alendronate (FOSAMAX) 70 MG tablet   calcium carbonate (OSCAL) 1500 (600 Ca) MG TABS tablet   cholecalciferol (VITAMIN D3) 25 MCG (1000 UNIT) tablet     Other   Chronic midline low back pain without sciatica   Relevant Medications   gabapentin (NEURONTIN) 300 MG capsule   Chronic pain of multiple joints   Relevant Medications   gabapentin (NEURONTIN) 300 MG capsule   Hyperlipidemia    Current use of zocor Repeat lipid panel      Relevant Orders   Lipid panel (Completed)   Other Visit Diagnoses     Encounter for preventative adult health care exam with abnormal findings    -  Primary   Relevant Orders   Comprehensive metabolic panel (Completed)      Return in about 6 months (around 03/28/2022) for hypothyrodism and, hyperlipidemia (fasting).     Alysia Penna, NP

## 2021-09-25 NOTE — Assessment & Plan Note (Signed)
Current use of zocor Repeat lipid panel

## 2021-09-25 NOTE — Telephone Encounter (Signed)
Dexa scan completed in 2022 indicates osteoporosis. I recommended use of fosamax tab once a week in combination with calcium 600mg  and vitamin D 1000Iu daily. Are you ok with above plan?

## 2021-11-27 ENCOUNTER — Telehealth: Payer: Self-pay | Admitting: *Deleted

## 2021-11-27 NOTE — Telephone Encounter (Signed)
Ortho bundle 90 day call completed. 

## 2022-01-21 ENCOUNTER — Encounter: Payer: Self-pay | Admitting: Orthopaedic Surgery

## 2022-01-21 ENCOUNTER — Ambulatory Visit (INDEPENDENT_AMBULATORY_CARE_PROVIDER_SITE_OTHER): Payer: Medicare PPO

## 2022-01-21 ENCOUNTER — Ambulatory Visit (INDEPENDENT_AMBULATORY_CARE_PROVIDER_SITE_OTHER): Payer: Medicare PPO | Admitting: Orthopaedic Surgery

## 2022-01-21 ENCOUNTER — Ambulatory Visit: Payer: Self-pay

## 2022-01-21 DIAGNOSIS — M25561 Pain in right knee: Secondary | ICD-10-CM

## 2022-01-21 DIAGNOSIS — Z96642 Presence of left artificial hip joint: Secondary | ICD-10-CM

## 2022-01-21 DIAGNOSIS — G8929 Other chronic pain: Secondary | ICD-10-CM

## 2022-01-21 DIAGNOSIS — M25562 Pain in left knee: Secondary | ICD-10-CM

## 2022-01-21 NOTE — Progress Notes (Signed)
The patient is well-known to me.  She is a very active and young appearing 76 year old female who is now over 6 months out from a left total hip arthroplasty.  She is of the left hip is doing well.  She has been dealing with some bilateral knee pain more with flexion and getting down on her knees.  It does not bother her going up and down stairs.  She has some mild pain with extremes of rotation of her right hip.  Her left operative hip moves smoothly and fluidly with no pain at all.  Her right hip does hurt on the extremes of internal and external rotation.  Both knees are ligamentously stable full range of motion with some slight patellofemoral rotation with the right hurting a little worse than the left.  Both knees have stability with varus and valgus stressing as well.   An AP pelvis and lateral left hip shows a well-seated total hip arthroplasty on the left side with no complicating features.  There is significant arthritis with superior lateral joint space narrowing of the right hip.  Examination of both knees shows mild to moderate tracking arthritis on the right knee with mild arthritis on the left knee.  We talked about quad strengthening exercises.  If her left hip bothers her in any way she knows to let us know.  If her right hip worsen she also knows to let us know and we can always see her for her knees.  I do feel her knees worst-case narrow knee potentially even hyaluronic acid injections.  All questions and concerns were answered and addressed.

## 2022-03-08 ENCOUNTER — Other Ambulatory Visit: Payer: Self-pay | Admitting: Nurse Practitioner

## 2022-03-08 DIAGNOSIS — E039 Hypothyroidism, unspecified: Secondary | ICD-10-CM

## 2022-03-08 NOTE — Telephone Encounter (Signed)
Chart supports Rx Last OV: 09/2021 Next OV: not scheduled, 30 day supply sent with 0 refills. Pt needs f/u appt for further refills.

## 2022-03-20 ENCOUNTER — Ambulatory Visit: Payer: Medicare PPO | Admitting: Nurse Practitioner

## 2022-03-20 ENCOUNTER — Encounter: Payer: Self-pay | Admitting: Nurse Practitioner

## 2022-03-20 VITALS — BP 126/70 | HR 111 | Temp 96.7°F | Ht 58.5 in | Wt 113.8 lb

## 2022-03-20 DIAGNOSIS — R9431 Abnormal electrocardiogram [ECG] [EKG]: Secondary | ICD-10-CM

## 2022-03-20 DIAGNOSIS — R0789 Other chest pain: Secondary | ICD-10-CM | POA: Diagnosis not present

## 2022-03-20 DIAGNOSIS — K5904 Chronic idiopathic constipation: Secondary | ICD-10-CM | POA: Diagnosis not present

## 2022-03-20 DIAGNOSIS — E039 Hypothyroidism, unspecified: Secondary | ICD-10-CM

## 2022-03-20 DIAGNOSIS — F4329 Adjustment disorder with other symptoms: Secondary | ICD-10-CM | POA: Insufficient documentation

## 2022-03-20 DIAGNOSIS — M810 Age-related osteoporosis without current pathological fracture: Secondary | ICD-10-CM

## 2022-03-20 DIAGNOSIS — E782 Mixed hyperlipidemia: Secondary | ICD-10-CM | POA: Diagnosis not present

## 2022-03-20 DIAGNOSIS — Z1231 Encounter for screening mammogram for malignant neoplasm of breast: Secondary | ICD-10-CM | POA: Diagnosis not present

## 2022-03-20 DIAGNOSIS — Z1211 Encounter for screening for malignant neoplasm of colon: Secondary | ICD-10-CM

## 2022-03-20 DIAGNOSIS — Z636 Dependent relative needing care at home: Secondary | ICD-10-CM | POA: Insufficient documentation

## 2022-03-20 HISTORY — DX: Dependent relative needing care at home: Z63.6

## 2022-03-20 HISTORY — DX: Other chest pain: R07.89

## 2022-03-20 LAB — RENAL FUNCTION PANEL
Albumin: 4.9 g/dL (ref 3.5–5.2)
BUN: 12 mg/dL (ref 6–23)
CO2: 27 mEq/L (ref 19–32)
Calcium: 9.6 mg/dL (ref 8.4–10.5)
Chloride: 104 mEq/L (ref 96–112)
Creatinine, Ser: 0.7 mg/dL (ref 0.40–1.20)
GFR: 84.74 mL/min (ref >60.00)
Glucose, Bld: 85 mg/dL (ref 70–99)
Phosphorus: 3.6 mg/dL (ref 2.3–4.6)
Potassium: 3.8 mEq/L (ref 3.5–5.1)
Sodium: 141 mEq/L (ref 135–145)

## 2022-03-20 LAB — HEPATIC FUNCTION PANEL
ALT: 21 U/L (ref 0–35)
AST: 28 U/L (ref 0–37)
Albumin: 4.9 g/dL (ref 3.5–5.2)
Alkaline Phosphatase: 109 U/L (ref 39–117)
Bilirubin, Direct: 0.2 mg/dL (ref 0.0–0.3)
Total Bilirubin: 0.7 mg/dL (ref 0.2–1.2)
Total Protein: 7.5 g/dL (ref 6.0–8.3)

## 2022-03-20 LAB — LIPID PANEL
Cholesterol: 226 mg/dL — ABNORMAL HIGH (ref 0–200)
HDL: 131.1 mg/dL (ref >39.00)
LDL Cholesterol: 80 mg/dL (ref 0–99)
NonHDL: 94.59
Total CHOL/HDL Ratio: 2
Triglycerides: 74 mg/dL (ref 0.0–149.0)
VLDL: 14.8 mg/dL (ref 0.0–40.0)

## 2022-03-20 LAB — T4, FREE: Free T4: 0.82 ng/dL (ref 0.60–1.60)

## 2022-03-20 LAB — TSH: TSH: 2.03 u[IU]/mL (ref 0.35–5.50)

## 2022-03-20 MED ORDER — ALENDRONATE SODIUM 10 MG PO TABS: 10.0000 mg | ORAL_TABLET | Freq: Every day | ORAL | 1 refills | Status: DC

## 2022-03-20 NOTE — Assessment & Plan Note (Signed)
Onset 55months ago Intermittent mid chest pain, last about 8seconds, occurs every 2-3days, not associated with any other symptoms, no known trigger. Reports high stress and anxiety at home due to care of disabled sister.  Reports consumption of ETOH use 2glasses per day, coffee 3cups per day, no soda, no tobacco use, no illicit drug use. Denies GERD symptoms or palpitations No known hx of CAD.  ECG today: NSR with mild elevation ST segment in chest leads (same changes noted in 2023 ECG). Normal intervals. Entered referral to cardiology Provided ED precautions

## 2022-03-20 NOTE — Assessment & Plan Note (Signed)
Repeat Tsh and T4  Wt Readings from Last 3 Encounters:  03/20/22 113 lb 12.8 oz (51.6 kg)  09/25/21 112 lb (50.8 kg)  06/08/21 110 lb 3.7 oz (50 kg)

## 2022-03-20 NOTE — Assessment & Plan Note (Signed)
Repeat lipid panel ?

## 2022-03-20 NOTE — Progress Notes (Signed)
Established Patient Visit  Patient: Lindsay Ferguson   DOB: [birth year]   77 y.o. Female  MRN: [id number] Visit Date: 03/20/2022  Subjective:    Chief Complaint  Patient presents with   Office Visit    Hypothyroid/ Hyperlipidemia  Pt fasting  C/o cramping in chest that comes & goes  Needs order placed for mobile mammo Requesting records for colonoscopy    HPI Chronic idiopathic constipation Unable to obtain records from [facility] after multiple requests. She declined referral to GI.She opted to complete a cologuard instead. Explain limitations of this test and possible need for colonoscopy if cologuard is positive.  Hypothyroidism, unspecified Repeat Tsh and T4  Wt Readings from Last 3 Encounters:  03/20/22 113 lb 12.8 oz (51.6 kg)  09/25/21 112 lb (50.8 kg)  06/08/21 110 lb 3.7 oz (50 kg)     Osteoporosis Reports inconsistency with fosamax 70mg  weekly dose. She opted to switch to daily dosing. New rx sent Advised to continue calcium and vit. D supplement Repeat dexa scan  Hyperlipidemia Repeat lipid panel  Atypical chest pain Onset 24months ago Intermittent mid chest pain, last about 8seconds, occurs every 2-3days, not associated with any other symptoms, no known trigger. Reports high stress and anxiety at home due to care of disabled sister.  Reports consumption of ETOH use 2glasses per day, coffee 3cups per day, no soda, no tobacco use, no illicit drug use. Denies GERD symptoms or palpitations No known hx of CAD.  ECG today: NSR with mild elevation ST segment in chest leads (same changes noted in 2023 ECG). Normal intervals. Entered referral to cardiology Provided ED precautions  Caregiver stress Caregiver to disabled sister. She declined [facility] referral and referral to psychology.  Reviewed medical, surgical, and social history today  Medications: Outpatient Medications Prior to Visit  Medication Sig   acetaminophen (TYLENOL) 650 MG  CR tablet Take 1,300 mg by mouth every 8 (eight) hours as needed for pain.   acyclovir (ZOVIRAX) 400 MG tablet Take 1 tablet (400 mg total) by mouth 4 (four) times daily as needed.   Aspirin Buf,CaCarb-MgCarb-MgO, 81 MG TABS Take 81 mg by mouth daily.   aspirin EC 81 MG tablet Take 1 tablet (81 mg total) by mouth 2 (two) times daily. Swallow whole.   calcium carbonate (OSCAL) 1500 (600 Ca) MG TABS tablet Take 1 tablet by mouth 2 (two) times daily with a meal.   cholecalciferol (VITAMIN D3) 25 MCG (1000 UNIT) tablet Take 1,000 Units by mouth daily.   gabapentin (NEURONTIN) 300 MG capsule 1cap in AM and 2caps in PM   levothyroxine (SYNTHROID) 50 MCG tablet TAKE 1 TABLET(50 MCG) BY MOUTH DAILY BEFORE BREAKFAST   meloxicam (MOBIC) 7.5 MG tablet TAKE 1 TABLET(7.5 MG) BY MOUTH DAILY WITH FOOD   simvastatin (ZOCOR) 20 MG tablet TAKE 1 TABLET(20 MG) BY MOUTH AT BEDTIME   [DISCONTINUED] alendronate (FOSAMAX) 70 MG tablet Take 1 tablet (70 mg total) by mouth once a week. Take with a full glass of water on an empty stomach.   No facility-administered medications prior to visit.   Reviewed past medical and social history.   ROS per HPI above      Objective:  BP 126/70 (BP Location: Right Arm, Patient Position: Sitting, Cuff Size: Small)   Pulse (!) 111   Temp (!) 96.7 F (35.9 C) (Temporal)   Ht 4' 10.5" (1.486 m)   Wt 113  lb 12.8 oz (51.6 kg)   SpO2 97%   BMI 23.38 kg/m      Physical Exam Cardiovascular:     Rate and Rhythm: Normal rate and regular rhythm.     Pulses: Normal pulses.     Heart sounds: Normal heart sounds.  Pulmonary:     Effort: Pulmonary effort is normal.     Breath sounds: Normal breath sounds.  Neurological:     Mental Status: She is alert and oriented to person, place, and time.  Psychiatric:        Attention and Perception: Attention normal.        Mood and Affect: Mood is anxious.        Speech: Speech normal.        Behavior: Behavior normal.        Thought  Content: Thought content normal.        Cognition and Memory: Cognition and memory normal.     No results found for any visits on 03/20/22.    Assessment & Plan:    Problem List Items Addressed This Visit       Digestive   Chronic idiopathic constipation    Unable to obtain records from [place] after multiple requests. She declined referral to GI.She opted to complete a cologuard instead. Explain limitations of this test and possible need for colonoscopy if cologuard is positive.        Endocrine   Hypothyroidism, unspecified    Repeat Tsh and T4  Wt Readings from Last 3 Encounters:  03/20/22 113 lb 12.8 oz (51.6 kg)  09/25/21 112 lb (50.8 kg)  06/08/21 110 lb 3.7 oz (50 kg)         Relevant Orders   T4, free   TSH     Musculoskeletal and Integument   Osteoporosis    Reports inconsistency with fosamax 70mg  weekly dose. She opted to switch to daily dosing. New rx sent Advised to continue calcium and vit. D supplement Repeat dexa scan      Relevant Medications   alendronate (FOSAMAX) 10 MG tablet   Other Relevant Orders   DG Bone Density   Renal Function Panel     Other   Atypical chest pain    Onset 41months ago Intermittent mid chest pain, last about 8seconds, occurs every 2-3days, not associated with any other symptoms, no known trigger. Reports high stress and anxiety at home due to care of disabled sister.  Reports consumption of ETOH use 2glasses per day, coffee 3cups per day, no soda, no tobacco use, no illicit drug use. Denies GERD symptoms or palpitations No known hx of CAD.  ECG today: NSR with mild elevation ST segment in chest leads (same changes noted in 2023 ECG). Normal intervals. Entered referral to cardiology Provided ED precautions      Relevant Orders   EKG 12-Lead (Completed)   Ambulatory referral to Cardiology   Caregiver stress    Caregiver to disabled sister. She declined [facility] referral and referral to psychology.       Hyperlipidemia - Primary    Repeat lipid panel      Relevant Orders   Hepatic function panel   Lipid panel   Other Visit Diagnoses     Colon cancer screening       Relevant Orders   Cologuard   Breast cancer screening by mammogram       Relevant Orders   MM 3D SCREEN BREAST BILATERAL   Abnormal ECG  Relevant Orders   Ambulatory referral to Cardiology      Return in about 6 months (around 09/18/2022) for CPE (fasting).     Wilfred Lacy, NP

## 2022-03-20 NOTE — Assessment & Plan Note (Signed)
Reports inconsistency with fosamax 70mg  weekly dose. She opted to switch to daily dosing. New rx sent Advised to continue calcium and vit. D supplement Repeat dexa scan

## 2022-03-20 NOTE — Patient Instructions (Signed)
Complete cologuard kit and return. You will be contacted to schedule appt with cardiology and for dexa scan Maintain appt for mammogram. Go to lab

## 2022-03-20 NOTE — Assessment & Plan Note (Signed)
Unable to obtain records from [place] after multiple requests. She declined referral to GI.She opted to complete a cologuard instead. Explain limitations of this test and possible need for colonoscopy if cologuard is positive.

## 2022-03-20 NOTE — Assessment & Plan Note (Signed)
Caregiver to disabled sister. She declined [facility] referral and referral to psychology.

## 2022-03-23 ENCOUNTER — Other Ambulatory Visit: Payer: Self-pay | Admitting: Nurse Practitioner

## 2022-03-23 DIAGNOSIS — E039 Hypothyroidism, unspecified: Secondary | ICD-10-CM

## 2022-03-23 MED ORDER — LEVOTHYROXINE SODIUM 50 MCG PO TABS: 50.0000 ug | ORAL_TABLET | Freq: Every day | ORAL | 1 refills | Status: DC

## 2022-03-28 ENCOUNTER — Other Ambulatory Visit: Payer: Self-pay | Admitting: Nurse Practitioner

## 2022-03-28 DIAGNOSIS — G8929 Other chronic pain: Secondary | ICD-10-CM

## 2022-03-28 NOTE — Telephone Encounter (Signed)
Chart supports Rx Last OV: 03/2022 Next OV: 09/2022  

## 2022-04-08 ENCOUNTER — Ambulatory Visit: Payer: Medicare PPO | Admitting: Internal Medicine

## 2022-04-29 ENCOUNTER — Ambulatory Visit
Admission: RE | Admit: 2022-04-29 | Discharge: 2022-04-29 | Disposition: A | Discharge status: Home / Self Care | Payer: Medicare PPO | Source: Ambulatory Visit | Attending: Nurse Practitioner | Admitting: Nurse Practitioner

## 2022-04-29 DIAGNOSIS — Z780 Asymptomatic menopausal state: Secondary | ICD-10-CM | POA: Diagnosis not present

## 2022-04-29 DIAGNOSIS — Z1231 Encounter for screening mammogram for malignant neoplasm of breast: Secondary | ICD-10-CM | POA: Diagnosis not present

## 2022-04-29 DIAGNOSIS — M810 Age-related osteoporosis without current pathological fracture: Secondary | ICD-10-CM

## 2022-05-02 NOTE — Progress Notes (Signed)
Stable Follow instructions as discussed during office visit.

## 2022-05-09 ENCOUNTER — Encounter: Payer: Self-pay | Admitting: Radiology

## 2022-06-06 ENCOUNTER — Ambulatory Visit: Payer: Medicare PPO | Admitting: Cardiology

## 2022-06-21 ENCOUNTER — Telehealth: Payer: Self-pay | Admitting: Nurse Practitioner

## 2022-06-21 NOTE — Telephone Encounter (Signed)
Contacted [name] to schedule their annual wellness visit. Appointment made for 06/28/22.  Lindsay Ferguson AWV direct phone # [phone]

## 2022-06-28 ENCOUNTER — Ambulatory Visit (INDEPENDENT_AMBULATORY_CARE_PROVIDER_SITE_OTHER): Payer: Medicare PPO

## 2022-06-28 VITALS — Ht 59.0 in | Wt 108.0 lb

## 2022-06-28 DIAGNOSIS — Z0000 Encounter for general adult medical examination without abnormal findings: Secondary | ICD-10-CM

## 2022-06-28 NOTE — Patient Instructions (Signed)
[name] , Thank you for taking time to come for your Medicare Wellness Visit. I appreciate your ongoing commitment to your health goals. Please review the following plan we discussed and let me know if I can assist you in the future.   These are the goals we discussed:  Goals      Patient Stated     06/28/2022, wants to lose 2-3 pounds        This is a list of the screening recommended for you and due dates:  Health Maintenance  Topic Date Due   Zoster (Shingles) Vaccine (1 of 2) Never done   Colon Cancer Screening  07/10/2021   COVID-19 Vaccine (6 - 2023-24 season) 11/02/2021   Flu Shot  10/03/2022   Mammogram  04/30/2023   Medicare Annual Wellness Visit  06/28/2023   Pneumonia Vaccine  Completed   DEXA scan (bone density measurement)  Completed   Hepatitis C Screening: USPSTF Recommendation to screen - Ages 97-79 yo.  Completed   HPV Vaccine  Aged Out   DTaP/Tdap/Td vaccine  Discontinued    Advanced directives: Please bring a copy of your POA (Power of [place]) and/or Living Will to your next appointment.   Conditions/risks identified: none  Next appointment: Follow up in one year for your annual wellness visit    Preventive Care 65 Years and Older, Female Preventive care refers to lifestyle choices and visits with your health care provider that can promote health and wellness. What does preventive care include? A yearly physical exam. This is also called an annual well check. Dental exams once or twice a year. Routine eye exams. Ask your health care provider how often you should have your eyes checked. Personal lifestyle choices, including: Daily care of your teeth and gums. Regular physical activity. Eating a healthy diet. Avoiding tobacco and drug use. Limiting alcohol use. Practicing safe sex. Taking low-dose aspirin every day. Taking vitamin and mineral supplements as recommended by your health care provider. What happens during an annual well check? The  services and screenings done by your health care provider during your annual well check will depend on your age, overall health, lifestyle risk factors, and family history of disease. Counseling  Your health care provider may ask you questions about your: Alcohol use. Tobacco use. Drug use. Emotional well-being. Home and relationship well-being. Sexual activity. Eating habits. History of falls. Memory and ability to understand (cognition). Work and work Astronomer. Reproductive health. Screening  You may have the following tests or measurements: Height, weight, and BMI. Blood pressure. Lipid and cholesterol levels. These may be checked every 5 years, or more frequently if you are over 35 years old. Skin check. Lung cancer screening. You may have this screening every year starting at age 79 if you have a 30-pack-year history of smoking and currently smoke or have quit within the past 15 years. Fecal occult blood test (FOBT) of the stool. You may have this test every year starting at age 37. Flexible sigmoidoscopy or colonoscopy. You may have a sigmoidoscopy every 5 years or a colonoscopy every 10 years starting at age 35. Hepatitis C blood test. Hepatitis B blood test. Sexually transmitted disease (STD) testing. Diabetes screening. This is done by checking your blood sugar (glucose) after you have not eaten for a while (fasting). You may have this done every 1-3 years. Bone density scan. This is done to screen for osteoporosis. You may have this done starting at age 59. Mammogram. This may be done every  1-2 years. Talk to your health care provider about how often you should have regular mammograms. Talk with your health care provider about your test results, treatment options, and if necessary, the need for more tests. Vaccines  Your health care provider may recommend certain vaccines, such as: Influenza vaccine. This is recommended every year. Tetanus, diphtheria, and acellular  pertussis (Tdap, Td) vaccine. You may need a Td booster every 10 years. Zoster vaccine. You may need this after age 40. Pneumococcal 13-valent conjugate (PCV13) vaccine. One dose is recommended after age 98. Pneumococcal polysaccharide (PPSV23) vaccine. One dose is recommended after age 68. Talk to your health care provider about which screenings and vaccines you need and how often you need them. This information is not intended to replace advice given to you by your health care provider. Make sure you discuss any questions you have with your health care provider. Document Released: 03/17/2015 Document Revised: 11/08/2015 Document Reviewed: 12/20/2014 Elsevier Interactive Patient Education  2017 [employer] Prevention in the Home Falls can cause injuries. They can happen to people of all ages. There are many things you can do to make your home safe and to help prevent falls. What can I do on the outside of my home? Regularly fix the edges of walkways and driveways and fix any cracks. Remove anything that might make you trip as you walk through a door, such as a raised step or threshold. Trim any bushes or trees on the path to your home. Use bright outdoor lighting. Clear any walking paths of anything that might make someone trip, such as rocks or tools. Regularly check to see if handrails are loose or broken. Make sure that both sides of any steps have handrails. Any raised decks and porches should have guardrails on the edges. Have any leaves, snow, or ice cleared regularly. Use sand or salt on walking paths during winter. Clean up any spills in your garage right away. This includes oil or grease spills. What can I do in the bathroom? Use night lights. Install grab bars by the toilet and in the tub and shower. Do not use towel bars as grab bars. Use non-skid mats or decals in the tub or shower. If you need to sit down in the shower, use a plastic, non-slip stool. Keep the floor  dry. Clean up any water that spills on the floor as soon as it happens. Remove soap buildup in the tub or shower regularly. Attach bath mats securely with double-sided non-slip rug tape. Do not have throw rugs and other things on the floor that can make you trip. What can I do in the bedroom? Use night lights. Make sure that you have a light by your bed that is easy to reach. Do not use any sheets or blankets that are too big for your bed. They should not hang down onto the floor. Have a firm chair that has side arms. You can use this for support while you get dressed. Do not have throw rugs and other things on the floor that can make you trip. What can I do in the kitchen? Clean up any spills right away. Avoid walking on wet floors. Keep items that you use a lot in easy-to-reach places. If you need to reach something above you, use a strong step stool that has a grab bar. Keep electrical cords out of the way. Do not use floor polish or wax that makes floors slippery. If you must use wax, use non-skid  floor wax. Do not have throw rugs and other things on the floor that can make you trip. What can I do with my stairs? Do not leave any items on the stairs. Make sure that there are handrails on both sides of the stairs and use them. Fix handrails that are broken or loose. Make sure that handrails are as long as the stairways. Check any carpeting to make sure that it is firmly attached to the stairs. Fix any carpet that is loose or worn. Avoid having throw rugs at the top or bottom of the stairs. If you do have throw rugs, attach them to the floor with carpet tape. Make sure that you have a light switch at the top of the stairs and the bottom of the stairs. If you do not have them, ask someone to add them for you. What else can I do to help prevent falls? Wear shoes that: Do not have high heels. Have rubber bottoms. Are comfortable and fit you well. Are closed at the toe. Do not wear  sandals. If you use a stepladder: Make sure that it is fully opened. Do not climb a closed stepladder. Make sure that both sides of the stepladder are locked into place. Ask someone to hold it for you, if possible. Clearly mark and make sure that you can see: Any grab bars or handrails. First and last steps. Where the edge of each step is. Use tools that help you move around (mobility aids) if they are needed. These include: Canes. Walkers. Scooters. Crutches. Turn on the lights when you go into a dark area. Replace any light bulbs as soon as they burn out. Set up your furniture so you have a clear path. Avoid moving your furniture around. If any of your floors are uneven, fix them. If there are any pets around you, be aware of where they are. Review your medicines with your doctor. Some medicines can make you feel dizzy. This can increase your chance of falling. Ask your doctor what other things that you can do to help prevent falls. This information is not intended to replace advice given to you by your health care provider. Make sure you discuss any questions you have with your health care provider. Document Released: 12/15/2008 Document Revised: 07/27/2015 Document Reviewed: 03/25/2014 Elsevier Interactive Patient Education  2017 [employer].

## 2022-06-28 NOTE — Progress Notes (Signed)
I connected with  [name] on 06/28/22 by a audio enabled telemedicine application and verified that I am speaking with the correct person using two identifiers.  Patient Location: Home  Provider Location: Office/Clinic  I discussed the limitations of evaluation and management by telemedicine. The patient expressed understanding and agreed to proceed.  Subjective:   Lindsay Ferguson is a 77 y.o. female who presents for Medicare Annual (Subsequent) preventive examination.  Review of Systems     Cardiac Risk Factors include: advanced age (>14men, >87 women);dyslipidemia     Objective:    Today's Vitals   06/28/22 0812  Weight: 108 lb (49 kg)  Height: 4\' 11"  (1.499 m)   Body mass index is 21.81 kg/m.     06/28/2022    8:16 AM 06/20/2021   10:44 AM 05/28/2021    8:46 AM 12/27/2019    3:38 PM  Advanced Directives  Does Patient Have a Medical Advance Directive? Yes No No No  Type of Estate agent of [place];Living will     Copy of Healthcare Power of Attorney in Chart? No - copy requested     Would patient like information on creating a medical advance directive?  No - Patient declined No - Patient declined Yes (MAU/Ambulatory/Procedural Areas - Information given)    Current Medications (verified) Outpatient Encounter Medications as of 06/28/2022  Medication Sig   acyclovir (ZOVIRAX) 400 MG tablet Take 1 tablet (400 mg total) by mouth 4 (four) times daily as needed.   alendronate (FOSAMAX) 10 MG tablet Take 1 tablet (10 mg total) by mouth daily before breakfast. Take with a full glass of water on an empty stomach.   aspirin EC 81 MG tablet Take 1 tablet (81 mg total) by mouth 2 (two) times daily. Swallow whole.   calcium carbonate (OSCAL) 1500 (600 Ca) MG TABS tablet Take 1 tablet by mouth 2 (two) times daily with a meal.   cholecalciferol (VITAMIN D3) 25 MCG (1000 UNIT) tablet Take 1,000 Units by mouth daily.   gabapentin (NEURONTIN) 300  MG capsule TAKE 1 CAPSULE BY MOUTH IN THE AM AND 2 CAPSULES IN THE PM   levothyroxine (SYNTHROID) 50 MCG tablet Take 1 tablet (50 mcg total) by mouth daily before breakfast.   meloxicam (MOBIC) 7.5 MG tablet TAKE 1 TABLET(7.5 MG) BY MOUTH DAILY WITH FOOD   simvastatin (ZOCOR) 20 MG tablet TAKE 1 TABLET(20 MG) BY MOUTH AT BEDTIME   acetaminophen (TYLENOL) 650 MG CR tablet Take 1,300 mg by mouth every 8 (eight) hours as needed for pain.   Aspirin Buf,CaCarb-MgCarb-MgO, 81 MG TABS Take 81 mg by mouth daily. (Patient not taking: Reported on 06/28/2022)   No facility-administered encounter medications on file as of 06/28/2022.    Allergies (verified) Oxycodone, Prednisone, and Codeine   History: Past Medical History:  Diagnosis Date   Herpesviral infection, unspecified 11/29/2016   Hyperlipidemia    Hypothyroidism    Tear of lateral cartilage or meniscus of knee, current 10/12/2013   Unilateral primary osteoarthritis, left hip 04/04/2021   Past Surgical History:  Procedure Laterality Date   ABDOMINAL HYSTERECTOMY     FACIAL FRACTURE SURGERY     SPINE SURGERY  1988   TOTAL HIP ARTHROPLASTY Left 06/08/2021   Procedure: LEFT TOTAL HIP ARTHROPLASTY ANTERIOR APPROACH;  Surgeon: Kathryne Hitch, MD;  Location: [facility];  Service: Orthopedics;  Laterality: Left;  Needs RNFA   Family History  Problem Relation Age of Onset   Cancer Mother  Diabetes Father    Breast cancer Sister    Social History   Socioeconomic History   Marital status: Married    Spouse name: Not on file   Number of children: Not on file   Years of education: Not on file   Highest education level: Not on file  Occupational History   Not on file  Tobacco Use   Smoking status: Never   Smokeless tobacco: Never  Vaping Use   Vaping Use: Never used  Substance and Sexual Activity   Alcohol use: Not Currently    Alcohol/week: 7.0 standard drinks of alcohol    Types: 7 Standard drinks or equivalent per week     Comment: Occasionally (wine)   Drug use: Never   Sexual activity: Not Currently  Other Topics Concern   Not on file  Social History Narrative   Not on file   Social Determinants of Health   Financial Resource Strain: Low Risk  (06/28/2022)   Overall Financial Resource Strain (CARDIA)    Difficulty of Paying Living Expenses: Not hard at all  Food Insecurity: No Food Insecurity (06/28/2022)   Hunger Vital Sign    Worried About Running Out of Food in the Last Year: Never true    Ran Out of Food in the Last Year: Never true  Transportation Needs: No Transportation Needs (06/28/2022)   PRAPARE - Administrator, Civil Service (Medical): No    Lack of Transportation (Non-Medical): No  Physical Activity: Inactive (06/28/2022)   Exercise Vital Sign    Days of Exercise per Week: 0 days    Minutes of Exercise per Session: 0 min  Stress: No Stress Concern Present (06/28/2022)   [employer] of Occupational Health - Occupational Stress Questionnaire    Feeling of Stress : Not at all  Social Connections: Moderately Isolated (06/20/2021)   Social Connection and Isolation Panel [NHANES]    Frequency of Communication with Friends and Family: Twice a week    Frequency of Social Gatherings with Friends and Family: Twice a week    Attends Religious Services: Never    Database administrator or Organizations: No    Attends Engineer, structural: Never    Marital Status: Married    Tobacco Counseling Counseling given: Not Answered   Clinical Intake:  Pre-visit preparation completed: Yes  Pain : No/denies pain     Nutritional Status: BMI of 19-24  Normal Nutritional Risks: None Diabetes: No  How often do you need to have someone help you when you read instructions, pamphlets, or other written materials from your doctor or pharmacy?: 1 - Never  Diabetic? no  Interpreter Needed?: No  Information entered by :: NAllen LPN   Activities of Daily Living     06/28/2022    8:17 AM  In your present state of health, do you have any difficulty performing the following activities:  Hearing? 0  Vision? 0  Difficulty concentrating or making decisions? 0  Walking or climbing stairs? 0  Dressing or bathing? 0  Doing errands, shopping? 0  Preparing Food and eating ? N  Using the Toilet? N  In the past six months, have you accidently leaked urine? N  Do you have problems with loss of bowel control? N  Managing your Medications? N  Managing your Finances? N  Housekeeping or managing your Housekeeping? N    Patient Care Team: Nche, Bonna Gains, NP as PCP - [facility] (Internal Medicine) [facility], [facility] any  recent Medical Services you may have received from other than [facility] providers in the past year (date may be approximate).     Assessment:   This is a routine wellness examination for Alleene.  Hearing/Vision screen Vision Screening - Comments:: No regular eye exams  Dietary issues and exercise activities discussed: Current Exercise Habits: The patient does not participate in regular exercise at present   Goals Addressed             This Visit's Progress    Patient Stated       06/28/2022, wants to lose 2-3 pounds       Depression Screen    06/28/2022    8:17 AM 03/20/2022    9:49 AM 09/25/2021    8:48 AM 06/20/2021   10:47 AM 06/20/2021   10:42 AM 04/26/2021    8:29 AM 03/21/2021    8:22 AM  PHQ 2/9 Scores  PHQ - 2 Score 0 4 2 0 0 0 1  PHQ- 9 Score  14 10    5     Fall Risk    06/28/2022    8:17 AM 06/20/2021   10:45 AM 04/26/2021    8:29 AM 03/21/2021    8:02 AM 08/09/2020    8:58 AM  Fall Risk   Falls in the past year? 0 0 0 0 0  Number falls in past yr: 0 0 0 0 0  Injury with Fall? 0 0 0 0 0  Risk for fall due to : Medication side effect Other (Comment) No Fall Risks No Fall Risks No Fall Risks  Risk for fall due to: Comment  recovering hip replacement     Follow up Falls prevention discussed;Education  provided;Falls evaluation completed Falls evaluation completed;Education provided Falls evaluation completed Falls evaluation completed Falls evaluation completed    FALL RISK PREVENTION PERTAINING TO THE HOME:  Any stairs in or around the home? Yes  If so, are there any without handrails? No  Home free of loose throw rugs in walkways, pet beds, electrical cords, etc? Yes  Adequate lighting in your home to reduce risk of falls? Yes   ASSISTIVE DEVICES UTILIZED TO PREVENT FALLS:  Life alert? No  Use of a cane, walker or w/c? No  Grab bars in the bathroom? No  Shower chair or bench in shower? Yes  Elevated toilet seat or a handicapped toilet? No   TIMED UP AND GO:  Was the test performed? No .      Cognitive Function:        06/28/2022    8:18 AM  6CIT Screen  What Year? 0 points  What month? 0 points  What time? 0 points  Count back from 20 0 points  Months in reverse 0 points  Repeat phrase 0 points  Total Score 0 points    Immunizations Immunization History  Administered Date(s) Administered   Influenza, High Dose Seasonal PF 12/06/2014, 11/23/2017, 12/02/2017, 11/20/2018   Influenza,inj,Quad PF,6+ Mos 12/06/2014   Influenza-Unspecified 11/29/2013, 12/06/2014, 11/26/2019, 12/11/2020   PFIZER(Purple Top)SARS-COV-2 Vaccination 05/06/2019, 06/05/2019, 01/21/2020, 07/13/2020, 12/11/2020   Pneumococcal Conjugate-13 08/11/2018   Pneumococcal Polysaccharide-23 04/24/2012, 04/24/2012, 06/19/2016, 06/19/2016   Zoster, Live 02/21/2011, 02/21/2011    TDAP status: Up to date  Flu Vaccine status: Up to date  Pneumococcal vaccine status: Up to date  Covid-19 vaccine status: Completed vaccines  Qualifies for Shingles Vaccine? Yes   Zostavax completed Yes   Shingrix Completed?: No.    Education has been provided regarding the  importance of this vaccine. Patient has been advised to call insurance company to determine out of pocket expense if they have not yet received  this vaccine. Advised may also receive vaccine at local pharmacy or Health Dept. Verbalized acceptance and understanding.  Screening Tests Health Maintenance  Topic Date Due   Zoster Vaccines- Shingrix (1 of 2) Never done   COLONOSCOPY (Pts 45-61yrs Insurance coverage will need to be confirmed)  07/10/2021   COVID-19 Vaccine (6 - 2023-24 season) 11/02/2021   Medicare Annual Wellness (AWV)  06/21/2022   INFLUENZA VACCINE  10/03/2022   MAMMOGRAM  04/30/2023   Pneumonia Vaccine 82+ Years old  Completed   DEXA SCAN  Completed   Hepatitis C Screening  Completed   HPV VACCINES  Aged Out   DTaP/Tdap/Td  Discontinued    Health Maintenance  Health Maintenance Due  Topic Date Due   Zoster Vaccines- Shingrix (1 of 2) Never done   COLONOSCOPY (Pts 45-81yrs Insurance coverage will need to be confirmed)  07/10/2021   COVID-19 Vaccine (6 - 2023-24 season) 11/02/2021   Medicare Annual Wellness (AWV)  06/21/2022    Colorectal cancer screening: Type of screening: Colonoscopy. Completed 07/10/2016. Repeat every 5 years  Mammogram status: Completed 04/29/2022. Repeat every year  Bone Density status: Completed 04/29/2022.   Lung Cancer Screening: (Low Dose CT Chest recommended if Age 71-80 years, 30 pack-year currently smoking OR have quit w/in 15years.) does not qualify.   Lung Cancer Screening Referral: no  Additional Screening:  Hepatitis C Screening: does qualify; Completed 03/06/2015  Vision Screening: Recommended annual ophthalmology exams for early detection of glaucoma and other disorders of the eye. Is the patient up to date with their annual eye exam?  No  Who is the provider or what is the name of the office in which the patient attends annual eye exams? none If pt is not established with a provider, would they like to be referred to a provider to establish care? No .   Dental Screening: Recommended annual dental exams for proper oral hygiene  Community Resource Referral / Chronic  Care Management: CRR required this visit?  No   CCM required this visit?  No      Plan:     I have personally reviewed and noted the following in the patient's chart:   Medical and social history Use of alcohol, tobacco or illicit drugs  Current medications and supplements including opioid prescriptions. Patient is not currently taking opioid prescriptions. Functional ability and status Nutritional status Physical activity Advanced directives List of other physicians Hospitalizations, surgeries, and ER visits in previous 12 months Vitals Screenings to include cognitive, depression, and falls Referrals and appointments  In addition, I have reviewed and discussed with patient certain preventive protocols, quality metrics, and best practice recommendations. A written personalized care plan for preventive services as well as general preventive health recommendations were provided to patient.     Barb Merino, LPN   06/10/8117   Nurse Notes: none  Due to this being a virtual visit, the after visit summary with patients personalized plan was offered to patient via mail or my-chart. Patient would like to access on my-chart

## 2022-08-16 ENCOUNTER — Other Ambulatory Visit: Payer: Self-pay | Admitting: Nurse Practitioner

## 2022-08-16 DIAGNOSIS — G8929 Other chronic pain: Secondary | ICD-10-CM

## 2022-08-16 DIAGNOSIS — E782 Mixed hyperlipidemia: Secondary | ICD-10-CM

## 2022-09-13 ENCOUNTER — Other Ambulatory Visit: Payer: Self-pay | Admitting: Nurse Practitioner

## 2022-09-13 DIAGNOSIS — M810 Age-related osteoporosis without current pathological fracture: Secondary | ICD-10-CM

## 2022-09-18 ENCOUNTER — Ambulatory Visit (INDEPENDENT_AMBULATORY_CARE_PROVIDER_SITE_OTHER): Payer: Medicare PPO | Admitting: Nurse Practitioner

## 2022-09-18 ENCOUNTER — Telehealth: Payer: Self-pay | Admitting: *Deleted

## 2022-09-18 ENCOUNTER — Encounter: Payer: Self-pay | Admitting: Nurse Practitioner

## 2022-09-18 VITALS — BP 90/62 | HR 63 | Temp 98.1°F | Resp 16 | Ht 59.0 in | Wt 108.0 lb

## 2022-09-18 DIAGNOSIS — Z0000 Encounter for general adult medical examination without abnormal findings: Secondary | ICD-10-CM

## 2022-09-18 DIAGNOSIS — G8929 Other chronic pain: Secondary | ICD-10-CM | POA: Diagnosis not present

## 2022-09-18 DIAGNOSIS — Z1211 Encounter for screening for malignant neoplasm of colon: Secondary | ICD-10-CM | POA: Diagnosis not present

## 2022-09-18 DIAGNOSIS — M2550 Pain in unspecified joint: Secondary | ICD-10-CM | POA: Diagnosis not present

## 2022-09-18 DIAGNOSIS — E782 Mixed hyperlipidemia: Secondary | ICD-10-CM | POA: Diagnosis not present

## 2022-09-18 DIAGNOSIS — M810 Age-related osteoporosis without current pathological fracture: Secondary | ICD-10-CM | POA: Diagnosis not present

## 2022-09-18 DIAGNOSIS — M545 Low back pain, unspecified: Secondary | ICD-10-CM

## 2022-09-18 DIAGNOSIS — E039 Hypothyroidism, unspecified: Secondary | ICD-10-CM

## 2022-09-18 DIAGNOSIS — Z0001 Encounter for general adult medical examination with abnormal findings: Secondary | ICD-10-CM

## 2022-09-18 LAB — LIPID PANEL
Cholesterol: 175 mg/dL (ref 0–200)
HDL: 86.6 mg/dL (ref >39.00)
LDL Cholesterol: 74 mg/dL (ref 0–99)
NonHDL: 88.03
Total CHOL/HDL Ratio: 2
Triglycerides: 72 mg/dL (ref 0.0–149.0)
VLDL: 14.4 mg/dL (ref 0.0–40.0)

## 2022-09-18 LAB — T4, FREE: Free T4: 0.96 ng/dL (ref 0.60–1.60)

## 2022-09-18 LAB — TSH: TSH: 0.99 u[IU]/mL (ref 0.35–5.50)

## 2022-09-18 MED ORDER — LEVOTHYROXINE SODIUM 50 MCG PO TABS
50.0000 ug | ORAL_TABLET | Freq: Every day | ORAL | 1 refills | Status: DC
Start: 2022-09-18 — End: 2022-10-21

## 2022-09-18 MED ORDER — GABAPENTIN 300 MG PO CAPS
ORAL_CAPSULE | ORAL | 5 refills | Status: DC
Start: 2022-09-18 — End: 2022-10-24

## 2022-09-18 NOTE — Patient Instructions (Addendum)
Schedule appointment with retail pharmacy for Shingrix vaccine. Call office if you do not get cologuard kit in 1-2weeks Start daily stretching exercise to help with flexibility. Try home yoga videos Go to lab

## 2022-09-18 NOTE — Progress Notes (Signed)
Complete physical exam  Patient: Lindsay Ferguson   DOB: 11-23-1945   77 y.o. Female  MRN: [id number] Visit Date: 09/18/2022  Subjective:    Chief Complaint  Patient presents with   Annual Exam    And f/up on chronic conditions (Fasting)    Lindsay Ferguson is a 77 y.o. female who presents today for a complete physical exam. She reports consuming a general diet.  Walking daily  She generally feels well. She reports sleeping well. She does have additional problems to discuss today.  Vision:No Dental:Yes STD Screen:No  BP Readings from Last 3 Encounters:  09/18/22 90/62  03/20/22 126/70  09/25/21 116/66   Wt Readings from Last 3 Encounters:  09/18/22 108 lb (49 kg)  06/28/22 108 lb (49 kg)  03/20/22 113 lb 12.8 oz (51.6 kg)    Most recent fall risk assessment:    09/18/2022    8:20 AM  Fall Risk   Falls in the past year? 0  Number falls in past yr: 0  Injury with Fall? 0  Risk for fall due to : No Fall Risks  Follow up Falls evaluation completed   Depression screen:Yes - No Depression Most recent depression screenings:    09/18/2022    8:20 AM 06/28/2022    8:17 AM  PHQ 2/9 Scores  PHQ - 2 Score 3 0  PHQ- 9 Score 8    HPI  Hyperlipidemia Reports discontinuation of ALCOHOL and candy x 6months Compliant with zocor dose  Repeat lipid panel today Maintain med dose  Hypothyroidism, unspecified Repeat Tsh and T4 Maintain med dose while waiting for lab results Wt Readings from Last 3 Encounters:  09/18/22 108 lb (49 kg)  06/28/22 108 lb (49 kg)  03/20/22 113 lb 12.8 oz (51.6 kg)     Osteoporosis Reports improved fosamax dose compliance Repeat dexa scan 04/2022: femoral neck -3.2, hip -2.9, forearm -2.9  Advised to maintain fosamax dose, calcium and vit. D Continue daily weight bearing exercise Repeat dexa scan in 1-72yrs  Past Medical History:  Diagnosis Date   Herpesviral infection, unspecified 11/29/2016   Hyperlipidemia    Hypothyroidism     Tear of lateral cartilage or meniscus of knee, current 10/12/2013   Unilateral primary osteoarthritis, left hip 04/04/2021   Past Surgical History:  Procedure Laterality Date   ABDOMINAL HYSTERECTOMY     FACIAL FRACTURE SURGERY     SPINE SURGERY  1988   TOTAL HIP ARTHROPLASTY Left 06/08/2021   Procedure: LEFT TOTAL HIP ARTHROPLASTY ANTERIOR APPROACH;  Surgeon: Kathryne Hitch, MD;  Location: [facility];  Service: Orthopedics;  Laterality: Left;  Needs RNFA   Social History   Socioeconomic History   Marital status: Married    Spouse name: Not on file   Number of children: Not on file   Years of education: Not on file   Highest education level: Not on file  Occupational History   Not on file  Tobacco Use   Smoking status: Never   Smokeless tobacco: Never  Vaping Use   Vaping status: Never Used  Substance and Sexual Activity   Alcohol use: Not Currently    Comment: Occasionally (wine)   Drug use: Never   Sexual activity: Not Currently    Birth control/protection: Post-menopausal  Other Topics Concern   Not on file  Social History Narrative   Not on file   Social Determinants of Health   Financial Resource Strain: Low Risk  (06/28/2022)   Overall Financial Resource  Strain (CARDIA)    Difficulty of Paying Living Expenses: Not hard at all  Food Insecurity: No Food Insecurity (06/28/2022)   Hunger Vital Sign    Worried About Running Out of Food in the Last Year: Never true    Ran Out of Food in the Last Year: Never true  Transportation Needs: No Transportation Needs (06/28/2022)   PRAPARE - Administrator, Civil Service (Medical): No    Lack of Transportation (Non-Medical): No  Physical Activity: Inactive (06/28/2022)   Exercise Vital Sign    Days of Exercise per Week: 0 days    Minutes of Exercise per Session: 0 min  Stress: No Stress Concern Present (06/28/2022)   [employer] of Occupational Health - Occupational Stress Questionnaire    Feeling of  Stress : Not at all  Social Connections: Moderately Isolated (06/20/2021)   Social Connection and Isolation Panel [NHANES]    Frequency of Communication with Friends and Family: Twice a week    Frequency of Social Gatherings with Friends and Family: Twice a week    Attends Religious Services: Never    Database administrator or Organizations: No    Attends Banker Meetings: Never    Marital Status: Married  Catering manager Violence: Not At Risk (06/20/2021)   Humiliation, Afraid, Rape, and Kick questionnaire    Fear of Current or Ex-Partner: No    Emotionally Abused: No    Physically Abused: No    Sexually Abused: No   Family Status  Relation Name Status   Mother  Deceased   Father  Deceased   Sister  Deceased  No partnership data on file   Family History  Problem Relation Age of Onset   Cancer Mother    Diabetes Father    Diabetes Sister    Stroke Sister    Breast cancer Sister    AAA (abdominal aortic aneurysm) Sister    Allergies  Allergen Reactions   Oxycodone    Prednisone Other (See Comments)    Raised blood pressure Elevated BP     Codeine Itching    Patient Care Team: Cheryln Balcom, Bonna Gains, NP as PCP - General (Internal Medicine) Center, [facility]   Medications: Outpatient Medications Prior to Visit  Medication Sig   acyclovir (ZOVIRAX) 400 MG tablet Take 1 tablet (400 mg total) by mouth 4 (four) times daily as needed.   alendronate (FOSAMAX) 10 MG tablet TAKE 1 TABLET(10 MG) BY MOUTH DAILY BEFORE BREAKFAST WITH A FULL GLASS OF WATER AND ON AN EMPTY STOMACH   aspirin EC 81 MG tablet Take 1 tablet (81 mg total) by mouth 2 (two) times daily. Swallow whole.   calcium carbonate (OSCAL) 1500 (600 Ca) MG TABS tablet Take 1 tablet by mouth 2 (two) times daily with a meal.   cholecalciferol (VITAMIN D3) 25 MCG (1000 UNIT) tablet Take 1,000 Units by mouth daily.   meloxicam (MOBIC) 7.5 MG tablet TAKE 1 TABLET(7.5 MG) BY MOUTH DAILY WITH FOOD    simvastatin (ZOCOR) 20 MG tablet TAKE 1 TABLET(20 MG) BY MOUTH AT BEDTIME   [DISCONTINUED] acetaminophen (TYLENOL) 650 MG CR tablet Take 1,300 mg by mouth every 8 (eight) hours as needed for pain.   [DISCONTINUED] Aspirin Buf,CaCarb-MgCarb-MgO, 81 MG TABS Take 81 mg by mouth daily. (Patient not taking: Reported on 06/28/2022)   [DISCONTINUED] gabapentin (NEURONTIN) 300 MG capsule TAKE 1 CAPSULE BY MOUTH IN THE AM AND 2 CAPSULES IN THE PM   [DISCONTINUED] levothyroxine (SYNTHROID) 50  MCG tablet Take 1 tablet (50 mcg total) by mouth daily before breakfast.   No facility-administered medications prior to visit.    Review of Systems  Constitutional:  Negative for activity change, appetite change and unexpected weight change.  Respiratory: Negative.    Cardiovascular: Negative.   Gastrointestinal: Negative.   Endocrine: Negative for cold intolerance and heat intolerance.  Genitourinary: Negative.   Musculoskeletal: Negative.   Skin: Negative.   Neurological: Negative.   Hematological: Negative.   Psychiatric/Behavioral:  Negative for behavioral problems, decreased concentration, dysphoric mood, hallucinations, self-injury, sleep disturbance and suicidal ideas. The patient is not nervous/anxious.     Last metabolic panel Lab Results  Component Value Date   GLUCOSE 85 03/20/2022   NA 141 03/20/2022   K 3.8 03/20/2022   CL 104 03/20/2022   CO2 27 03/20/2022   BUN 12 03/20/2022   CREATININE 0.70 03/20/2022   GFR 84.74 03/20/2022   CALCIUM 9.6 03/20/2022   PHOS 3.6 03/20/2022   PROT 7.5 03/20/2022   ALBUMIN 4.9 03/20/2022   ALBUMIN 4.9 03/20/2022   BILITOT 0.7 03/20/2022   ALKPHOS 109 03/20/2022   AST 28 03/20/2022   ALT 21 03/20/2022   ANIONGAP 7 05/28/2021   Last lipids Lab Results  Component Value Date   CHOL 226 (H) 03/20/2022   HDL 131.10 03/20/2022   LDLCALC 80 03/20/2022   TRIG 74.0 03/20/2022   CHOLHDL 2 03/20/2022   Last thyroid functions Lab Results  Component  Value Date   TSH 2.03 03/20/2022      Objective:  BP 90/62 (BP Location: Left Arm, Patient Position: Sitting, Cuff Size: Normal)   Pulse 63   Temp 98.1 F (36.7 C) (Oral)   Resp 16   Ht 4\' 11"  (1.499 m)   Wt 108 lb (49 kg)   SpO2 98%   BMI 21.81 kg/m     Physical Exam Vitals and nursing note reviewed.  Constitutional:      General: She is not in acute distress. HENT:     Right Ear: Tympanic membrane, ear canal and external ear normal.     Left Ear: Tympanic membrane, ear canal and external ear normal.     Nose: Nose normal.  Eyes:     Extraocular Movements: Extraocular movements intact.     Conjunctiva/sclera: Conjunctivae normal.     Pupils: Pupils are equal, round, and reactive to light.  Neck:     Thyroid: No thyroid mass, thyromegaly or thyroid tenderness.  Cardiovascular:     Rate and Rhythm: Normal rate and regular rhythm.     Pulses: Normal pulses.     Heart sounds: Normal heart sounds.  Pulmonary:     Effort: Pulmonary effort is normal.     Breath sounds: Normal breath sounds.  Abdominal:     General: Bowel sounds are normal.     Palpations: Abdomen is soft.  Musculoskeletal:     Cervical back: Normal range of motion and neck supple.     Lumbar back: Normal.     Right hip: Crepitus present. No tenderness or bony tenderness. Decreased range of motion. Normal strength.     Left hip: Crepitus present. No tenderness or bony tenderness. Decreased range of motion. Normal strength.     Right upper leg: Normal.     Left upper leg: Normal.     Right knee: Normal.     Left knee: Normal.     Right lower leg: Normal. No edema.     Left lower  leg: Normal. No edema.     Right ankle: Normal.     Left ankle: Normal.     Right foot: Normal.     Left foot: Normal.  Lymphadenopathy:     Cervical: No cervical adenopathy.  Skin:    General: Skin is warm and dry.  Neurological:     Mental Status: She is alert and oriented to person, place, and time.     Cranial Nerves:  No cranial nerve deficit.  Psychiatric:        Mood and Affect: Mood normal.        Behavior: Behavior normal.        Thought Content: Thought content normal.     No results found for any visits on 09/18/22.    Assessment & Plan:    Routine Health Maintenance and Physical Exam  Immunization History  Administered Date(s) Administered   Influenza, High Dose Seasonal PF 12/06/2014, 11/23/2017, 12/02/2017, 11/20/2018   Influenza,inj,Quad PF,6+ Mos 12/06/2014   Influenza-Unspecified 11/29/2013, 12/06/2014, 11/26/2019, 12/11/2020   PFIZER(Purple Top)SARS-COV-2 Vaccination 05/06/2019, 06/05/2019, 01/21/2020, 07/13/2020, 12/11/2020   Pneumococcal Conjugate-13 08/11/2018   Pneumococcal Polysaccharide-23 04/24/2012, 04/24/2012, 06/19/2016, 06/19/2016   Zoster, Live 02/21/2011, 02/21/2011    Health Maintenance  Topic Date Due   Zoster Vaccines- Shingrix (1 of 2) 01/08/1997   Colonoscopy  07/10/2021   COVID-19 Vaccine (6 - 2023-24 season) 11/02/2021   INFLUENZA VACCINE  10/03/2022   MAMMOGRAM  04/30/2023   Medicare Annual Wellness (AWV)  06/28/2023   Pneumonia Vaccine 97+ Years old  Completed   DEXA SCAN  Completed   Hepatitis C Screening  Completed   HPV VACCINES  Aged Out   DTaP/Tdap/Td  Discontinued   Discussed health benefits of physical activity, and encouraged her to engage in regular exercise appropriate for her age and condition. Schedule appointment with retail pharmacy for Shingrix vaccine. Call office if you do not get cologuard kit in 1-2weeks Start daily stretching exercise to help with flexibility. Try home yoga videos  Problem List Items Addressed This Visit       Endocrine   Hypothyroidism, unspecified    Repeat Tsh and T4 Maintain med dose while waiting for lab results Wt Readings from Last 3 Encounters:  09/18/22 108 lb (49 kg)  06/28/22 108 lb (49 kg)  03/20/22 113 lb 12.8 oz (51.6 kg)         Relevant Medications   levothyroxine (SYNTHROID) 50 MCG  tablet   Other Relevant Orders   TSH   T4, free     Musculoskeletal and Integument   Osteoporosis    Reports improved fosamax dose compliance Repeat dexa scan 04/2022: femoral neck -3.2, hip -2.9, forearm -2.9  Advised to maintain fosamax dose, calcium and vit. D Continue daily weight bearing exercise Repeat dexa scan in 1-34yrs        Other   Chronic midline low back pain without sciatica   Relevant Medications   gabapentin (NEURONTIN) 300 MG capsule   Chronic pain of multiple joints   Relevant Medications   gabapentin (NEURONTIN) 300 MG capsule   Hyperlipidemia    Reports discontinuation of ALCOHOL and candy x 6months Compliant with zocor dose  Repeat lipid panel today Maintain med dose      Relevant Orders   Lipid panel   Other Visit Diagnoses     Encounter for preventative adult health care exam with abnormal findings    -  Primary   Relevant Orders   Cologuard   Colon cancer  screening       Relevant Orders   Cologuard      Return in about 6 months (around 03/21/2023) for Hypothyroidism, hyperlipidemia (fasting).     Alysia Penna, NP

## 2022-09-18 NOTE — Assessment & Plan Note (Signed)
Reports improved fosamax dose compliance Repeat dexa scan 04/2022: femoral neck -3.2, hip -2.9, forearm -2.9  Advised to maintain fosamax dose, calcium and vit. D Continue daily weight bearing exercise Repeat dexa scan in 1-30yrs

## 2022-09-18 NOTE — Assessment & Plan Note (Signed)
Reports discontinuation of ALCOHOL and candy x 6months Compliant with zocor dose  Repeat lipid panel today Maintain med dose

## 2022-09-18 NOTE — Assessment & Plan Note (Signed)
Repeat Tsh and T4 Maintain med dose while waiting for lab results Wt Readings from Last 3 Encounters:  09/18/22 108 lb (49 kg)  06/28/22 108 lb (49 kg)  03/20/22 113 lb 12.8 oz (51.6 kg)

## 2022-09-18 NOTE — Telephone Encounter (Signed)
 Ortho bundle 1 year call completed. ?

## 2022-09-24 DIAGNOSIS — Z1211 Encounter for screening for malignant neoplasm of colon: Secondary | ICD-10-CM | POA: Diagnosis not present

## 2022-09-25 LAB — COLOGUARD: Cologuard: NEGATIVE

## 2022-10-20 ENCOUNTER — Other Ambulatory Visit: Payer: Self-pay | Admitting: Nurse Practitioner

## 2022-10-20 DIAGNOSIS — E039 Hypothyroidism, unspecified: Secondary | ICD-10-CM

## 2022-10-23 ENCOUNTER — Other Ambulatory Visit: Payer: Self-pay | Admitting: Nurse Practitioner

## 2022-10-23 DIAGNOSIS — G8929 Other chronic pain: Secondary | ICD-10-CM

## 2022-10-23 DIAGNOSIS — M545 Low back pain, unspecified: Secondary | ICD-10-CM

## 2022-10-31 ENCOUNTER — Ambulatory Visit: Payer: Medicare PPO | Admitting: Cardiology

## 2023-01-23 ENCOUNTER — Other Ambulatory Visit: Payer: Self-pay | Admitting: Nurse Practitioner

## 2023-01-23 NOTE — Telephone Encounter (Signed)
LR- 02/01/21 LOV  09/18/22 FOV  03/21/23  Please review and advise.  Thanks. Dm/cma

## 2023-03-04 ENCOUNTER — Ambulatory Visit: Payer: Medicare PPO | Admitting: Nurse Practitioner

## 2023-03-04 ENCOUNTER — Encounter: Payer: Self-pay | Admitting: Nurse Practitioner

## 2023-03-04 ENCOUNTER — Ambulatory Visit: Payer: Self-pay | Admitting: Nurse Practitioner

## 2023-03-04 VITALS — BP 122/76 | HR 70 | Temp 97.0°F | Wt 112.4 lb

## 2023-03-04 DIAGNOSIS — R399 Unspecified symptoms and signs involving the genitourinary system: Secondary | ICD-10-CM | POA: Diagnosis not present

## 2023-03-04 DIAGNOSIS — N3001 Acute cystitis with hematuria: Secondary | ICD-10-CM | POA: Diagnosis not present

## 2023-03-04 LAB — POC URINALSYSI DIPSTICK (AUTOMATED)
Bilirubin, UA: NEGATIVE
Blood, UA: POSITIVE
Glucose, UA: NEGATIVE
Ketones, UA: NEGATIVE
Nitrite, UA: NEGATIVE
Protein, UA: NEGATIVE
Spec Grav, UA: 1.01 (ref 1.010–1.025)
Urobilinogen, UA: 0.2 U/dL
pH, UA: 7 (ref 5.0–8.0)

## 2023-03-04 MED ORDER — CEPHALEXIN 500 MG PO CAPS: 500.0000 mg | ORAL_CAPSULE | Freq: Two times a day (BID) | ORAL | 0 refills | Status: DC

## 2023-03-04 NOTE — Telephone Encounter (Signed)
  Chief Complaint: painful urination Symptoms: pain with urination, increase in urge to urinate and low urine stream Frequency: started on Sunday Pertinent Negatives: Patient denies blood in urine Disposition: [] ED /[] Urgent Care (no appt availability in office) / [x] Appointment(In office/virtual)/ []  [place] [place] [facility]/ [] Home Care/ [] Refused Recommended Disposition /[] [place] [facility]/ []  Follow-up with PCP Additional Notes: pt stated taking tylenol /ibuprofen for discomfort with no relief: wants to be seen for possible ABX.  In office appt made with another provider in same office as PCP for today: home care education provider for pt as well. Reason for Disposition  All other patients with painful urination  (Exception: [1] EITHER frequency or urgency AND [2] has on-call doctor.)  Answer Assessment - Initial Assessment Questions 1. SEVERITY: How bad is the pain?  (e.g., Scale 1-10; mild, moderate, or severe)   - MILD (1-3): complains slightly about urination hurting   - MODERATE (4-7): interferes with normal activities     - SEVERE (8-10): excruciating, unwilling or unable to urinate because of the pain      mild 2. FREQUENCY: How many times have you had painful urination today?      Increase in urge to urinate however urine stream is low 3. PATTERN: Is pain present every time you urinate or just sometimes?      Burns everytime pt goes to urinate  4. ONSET: When did the painful urination start?      Started Sunday 5. FEVER: Do you have a fever? If Yes, ask: What is your temperature, how was it measured, and when did it start?     no 6. PAST UTI: Have you had a urine infection before? If Yes, ask: When was the last time? and What happened that time?      Yes and needed  ABX to cure 7. CAUSE: What do you think is causing the painful urination?  (e.g., UTI, scratch, Herpes sore)     N/a  8. OTHER SYMPTOMS: Do you have any other symptoms? (e.g., blood in  urine, flank pain, genital sores, urgency, vaginal discharge)     N/a 9. PREGNANCY: Is there any chance you are pregnant? When was your last menstrual period?     N/a  Protocols used: Urination Pain - Female-A-AH

## 2023-03-04 NOTE — Patient Instructions (Signed)
 It was great to see you!  Keep drinking plenty of fluids  Start keflex twice a day for 7 days   Let's follow-up if your symptoms worsen or don't improve   Take care,  Rodman Pickle, NP

## 2023-03-04 NOTE — Progress Notes (Signed)
 Acute Office Visit  Subjective:     Patient ID: Lindsay Ferguson, female    DOB: [birth year], 77 y.o.   MRN: [id number]  Chief Complaint  Patient presents with   UTI symptoms    Burning with little urination x Sunday    HPI Patient is in today for dysuria for 2 days.   URINARY SYMPTOMS  Dysuria: burning Urinary frequency: yes Urgency: yes Small volume voids: yes Symptom severity:  fluctuating Urinary incontinence: no Foul odor: no Hematuria: no Abdominal pain: no Back pain: no Suprapubic pain/pressure: no Flank pain: no Fever:  no Vomiting: no Relief with cranberry juice:  n/a Relief with pyridium:  n/a Status: fluctuating Previous urinary tract infection: yes Recurrent urinary tract infection: no Treatments attempted: increasing fluids   ROS See pertinent positives and negatives per HPI.     Objective:    BP 122/76   Pulse 70   Temp (!) 97 F (36.1 C) (Temporal)   Wt 112 lb 6.4 oz (51 kg)   SpO2 98%   BMI 22.70 kg/m    Physical Exam Vitals and nursing note reviewed.  Constitutional:      General: She is not in acute distress.    Appearance: Normal appearance.  HENT:     Head: Normocephalic.  Eyes:     Conjunctiva/sclera: Conjunctivae normal.  Pulmonary:     Effort: Pulmonary effort is normal.  Abdominal:     Palpations: Abdomen is soft.     Tenderness: There is abdominal tenderness. There is no right CVA tenderness or left CVA tenderness.  Musculoskeletal:     Cervical back: Normal range of motion.  Skin:    General: Skin is warm.  Neurological:     General: No focal deficit present.     Mental Status: She is alert and oriented to person, place, and time.  Psychiatric:        Mood and Affect: Mood normal.        Behavior: Behavior normal.        Thought Content: Thought content normal.        Judgment: Judgment normal.     Results for orders placed or performed in visit on 03/04/23  POCT Urinalysis Dipstick (Automated)   Result Value Ref Range   Color, UA yellow    Clarity, UA clear    Glucose, UA Negative Negative   Bilirubin, UA neg    Ketones, UA neg    Spec Grav, UA 1.010 1.010 - 1.025   Blood, UA positive    pH, UA 7.0 5.0 - 8.0   Protein, UA Negative Negative   Urobilinogen, UA 0.2 0.2 or 1.0 E.U./dL   Nitrite, UA neg    Leukocytes, UA Large (3+) (A) Negative        Assessment & Plan:   Problem List Items Addressed This Visit   None Visit Diagnoses       Acute cystitis with hematuria    -  Primary   Treat with keflex  500mg  BID x7 days. Encourage fluids. Add on urine culture. F/U if not improving   Relevant Orders   Urine Culture     UTI symptoms       U/A showed blood and 3+ leukocytes. Will treat for UTI.   Relevant Orders   POCT Urinalysis Dipstick (Automated) (Completed)       Meds ordered this encounter  Medications   cephALEXin  (KEFLEX ) 500 MG capsule    Sig: Take 1 capsule (500 mg total) by  mouth 2 (two) times daily.    Dispense:  14 capsule    Refill:  0    Return if symptoms worsen or fail to improve.  Tinnie DELENA Harada, NP

## 2023-03-04 NOTE — Telephone Encounter (Signed)
Patient seen in office 03/04/23

## 2023-03-07 ENCOUNTER — Telehealth: Payer: Self-pay

## 2023-03-07 NOTE — Telephone Encounter (Signed)
 Pt has an acute visit scheduled 05/13/23, appt note: painful urination. Called pt to clarify if this was an error. Pt confirmed, appt cancelled.

## 2023-03-11 ENCOUNTER — Ambulatory Visit: Payer: Medicare PPO | Admitting: Nurse Practitioner

## 2023-03-11 ENCOUNTER — Telehealth: Payer: Self-pay

## 2023-03-11 ENCOUNTER — Other Ambulatory Visit: Payer: Self-pay

## 2023-03-11 DIAGNOSIS — N3001 Acute cystitis with hematuria: Secondary | ICD-10-CM

## 2023-03-11 LAB — URINE CULTURE

## 2023-03-11 LAB — EXTRA URINE SPECIMEN

## 2023-03-11 NOTE — Telephone Encounter (Signed)
 Called patient to inform that urine sample will need to be recollected due to inadequate volume received. Patient vebalized understanding and was added to lab schedule on 03/12/2023 @ 9:00 am

## 2023-03-12 ENCOUNTER — Other Ambulatory Visit: Payer: Medicare PPO

## 2023-03-12 DIAGNOSIS — N3001 Acute cystitis with hematuria: Secondary | ICD-10-CM

## 2023-03-13 LAB — URINE CULTURE
MICRO NUMBER:: 15932662
Result:: NO GROWTH
SPECIMEN QUALITY:: ADEQUATE

## 2023-03-17 ENCOUNTER — Encounter: Payer: Self-pay | Admitting: Nurse Practitioner

## 2023-03-17 ENCOUNTER — Ambulatory Visit: Payer: Medicare PPO | Admitting: Nurse Practitioner

## 2023-03-17 VITALS — BP 122/72 | HR 72 | Temp 97.9°F | Resp 18 | Wt 114.4 lb

## 2023-03-17 DIAGNOSIS — K635 Polyp of colon: Secondary | ICD-10-CM | POA: Insufficient documentation

## 2023-03-17 DIAGNOSIS — E039 Hypothyroidism, unspecified: Secondary | ICD-10-CM | POA: Diagnosis not present

## 2023-03-17 DIAGNOSIS — E782 Mixed hyperlipidemia: Secondary | ICD-10-CM | POA: Diagnosis not present

## 2023-03-17 DIAGNOSIS — K5904 Chronic idiopathic constipation: Secondary | ICD-10-CM | POA: Diagnosis not present

## 2023-03-17 DIAGNOSIS — M810 Age-related osteoporosis without current pathological fracture: Secondary | ICD-10-CM

## 2023-03-17 HISTORY — DX: Polyp of colon: K63.5

## 2023-03-17 LAB — COMPREHENSIVE METABOLIC PANEL
ALT: 15 U/L (ref 0–35)
AST: 26 U/L (ref 0–37)
Albumin: 4.5 g/dL (ref 3.5–5.2)
Alkaline Phosphatase: 51 U/L (ref 39–117)
BUN: 8 mg/dL (ref 6–23)
CO2: 26 meq/L (ref 19–32)
Calcium: 9.8 mg/dL (ref 8.4–10.5)
Chloride: 104 meq/L (ref 96–112)
Creatinine, Ser: 0.66 mg/dL (ref 0.40–1.20)
GFR: 85.35 mL/min (ref >60.00)
Glucose, Bld: 83 mg/dL (ref 70–99)
Potassium: 4.3 meq/L (ref 3.5–5.1)
Sodium: 139 meq/L (ref 135–145)
Total Bilirubin: 0.5 mg/dL (ref 0.2–1.2)
Total Protein: 6.5 g/dL (ref 6.0–8.3)

## 2023-03-17 LAB — CBC
HCT: 39.2 % (ref 36.0–46.0)
Hemoglobin: 13.1 g/dL (ref 12.0–15.0)
MCHC: 33.4 g/dL (ref 30.0–36.0)
MCV: 98.7 fL (ref 78.0–100.0)
Platelets: 187 10*3/uL (ref 150.0–400.0)
RBC: 3.97 Mil/uL (ref 3.87–5.11)
RDW: 13.1 % (ref 11.5–15.5)
WBC: 5.6 10*3/uL (ref 4.0–10.5)

## 2023-03-17 LAB — TSH: TSH: 1.65 u[IU]/mL (ref 0.35–5.50)

## 2023-03-17 LAB — LIPID PANEL
Cholesterol: 182 mg/dL (ref 0–200)
HDL: 93.5 mg/dL (ref >39.00)
LDL Cholesterol: 74 mg/dL (ref 0–99)
NonHDL: 88.23
Total CHOL/HDL Ratio: 2
Triglycerides: 70 mg/dL (ref 0.0–149.0)
VLDL: 14 mg/dL (ref 0.0–40.0)

## 2023-03-17 LAB — T4, FREE: Free T4: 0.91 ng/dL (ref 0.60–1.60)

## 2023-03-17 MED ORDER — LUBIPROSTONE 8 MCG PO CAPS: 8.0000 ug | ORAL_CAPSULE | Freq: Two times a day (BID) | ORAL | 1 refills | Status: DC

## 2023-03-17 NOTE — Assessment & Plan Note (Signed)
 No adverse effects with zocor Repeat lipid panel

## 2023-03-17 NOTE — Patient Instructions (Addendum)
 Schedule appointment for repeat colonoscopy with [facility] Start calcium and vit. D 600mg  /500IU daily Maintain adequate oral hydration and high fiber diet. Go to lab

## 2023-03-17 NOTE — Assessment & Plan Note (Signed)
 Reports improved fosamax dose compliance Repeat dexa scan 04/2022: femoral neck -3.2, hip -2.9, forearm -2.9  Advised to maintain fosamax dose, calcium and vit. D-600mg /500IU Continue daily weight bearing exercise Repeat dexa scan

## 2023-03-17 NOTE — Assessment & Plan Note (Signed)
 Bloating with miralax and metamucil Minimal relief with dulcolax-4tabs EOD Unable to afford linzess. Agreed to start amitiza BID Maintain adequate oral hydration and high fiber diet.

## 2023-03-17 NOTE — Assessment & Plan Note (Signed)
 Last colonoscopy 2018 Cologaurd completed 2024: negative We discussed limitation of cologuard test and possible false negative. She agreed to schedule appointment with [facility] for repeat colonoscopy

## 2023-03-17 NOTE — Progress Notes (Signed)
 Established Patient Visit  Patient: Lindsay Ferguson   DOB: 28-Jun-1945   78 y.o. Female  MRN: [id number] Visit Date: 03/17/2023  Subjective:    Chief Complaint  Patient presents with   Medical Managment for Chronic Issues     6 month follow up hypothyroidism and hyperlipidemia; PT cologuard done 09/28/2022 with negative results   HPI Chronic idiopathic constipation Bloating with miralax and metamucil Minimal relief with dulcolax-4tabs EOD Unable to afford linzess. Agreed to start amitiza  8mcg BID Maintain adequate oral hydration and high fiber diet.  Polyp of colon Last colonoscopy 2018 Cologaurd completed 2024: negative We discussed limitation of cologuard test and possible false negative. She agreed to schedule appointment with [facility] for repeat colonoscopy  Hypothyroidism, unspecified Repeat Tsh and T4 Maintain med dose while waiting for lab results Wt Readings from Last 3 Encounters:  03/17/23 114 lb 6.4 oz (51.9 kg)  03/04/23 112 lb 6.4 oz (51 kg)  09/18/22 108 lb (49 kg)     Osteoporosis Reports improved fosamax  dose compliance Repeat dexa scan 04/2022: femoral neck -3.2, hip -2.9, forearm -2.9  Advised to maintain fosamax  dose, calcium and vit. D-600mg /500IU Continue daily weight bearing exercise Repeat dexa scan  Hyperlipidemia No adverse effects with zocor  Repeat lipid panel  Wt Readings from Last 3 Encounters:  03/17/23 114 lb 6.4 oz (51.9 kg)  03/04/23 112 lb 6.4 oz (51 kg)  09/18/22 108 lb (49 kg)    Reviewed medical, surgical, and social history today  Medications: Outpatient Medications Prior to Visit  Medication Sig   acyclovir  (ZOVIRAX ) 400 MG tablet TAKE 1 TABLET(400 MG) BY MOUTH FOUR TIMES DAILY AS NEEDED   alendronate  (FOSAMAX ) 10 MG tablet TAKE 1 TABLET(10 MG) BY MOUTH DAILY BEFORE BREAKFAST WITH A FULL GLASS OF WATER  AND ON AN EMPTY STOMACH   aspirin  EC 81 MG tablet Take 1 tablet (81 mg total) by mouth 2 (two) times  daily. Swallow whole.   calcium carbonate (OSCAL) 1500 (600 Ca) MG TABS tablet Take 1 tablet by mouth 2 (two) times daily with a meal.   cephALEXin  (KEFLEX ) 500 MG capsule Take 1 capsule (500 mg total) by mouth 2 (two) times daily.   cholecalciferol (VITAMIN D3) 25 MCG (1000 UNIT) tablet Take 1,000 Units by mouth daily.   gabapentin  (NEURONTIN ) 300 MG capsule TAKE ONE CAPSULE BY MOUTH IN THE MORNING AND 2 CAPSULES IN THE EVENING   levothyroxine  (SYNTHROID ) 50 MCG tablet TAKE 1 TABLET(50 MCG) BY MOUTH DAILY BEFORE BREAKFAST   meloxicam  (MOBIC ) 7.5 MG tablet TAKE 1 TABLET(7.5 MG) BY MOUTH DAILY WITH FOOD   simvastatin  (ZOCOR ) 20 MG tablet TAKE 1 TABLET(20 MG) BY MOUTH AT BEDTIME   No facility-administered medications prior to visit.   Reviewed past medical and social history.   ROS per HPI above      Objective:  BP 122/72 (BP Location: Left Arm, Patient Position: Sitting, Cuff Size: Large)   Pulse 72   Temp 97.9 F (36.6 C) (Temporal)   Resp 18   Wt 114 lb 6.4 oz (51.9 kg)   SpO2 100%   BMI 23.11 kg/m      Physical Exam Vitals and nursing note reviewed.  Cardiovascular:     Rate and Rhythm: Normal rate and regular rhythm.     Pulses: Normal pulses.     Heart sounds: Normal heart sounds.  Pulmonary:     Effort: Pulmonary effort is  normal.     Breath sounds: Normal breath sounds.  Neurological:     Mental Status: She is alert and oriented to person, place, and time.     No results found for any visits on 03/17/23.    Assessment & Plan:    Problem List Items Addressed This Visit     Chronic idiopathic constipation   Bloating with miralax and metamucil Minimal relief with dulcolax-4tabs EOD Unable to afford linzess. Agreed to start amitiza  8mcg BID Maintain adequate oral hydration and high fiber diet.      Relevant Medications   lubiprostone  (AMITIZA ) 8 MCG capsule   Other Relevant Orders   Ambulatory referral to Gastroenterology   Hyperlipidemia   No adverse  effects with zocor  Repeat lipid panel      Relevant Orders   Lipid panel   Comprehensive metabolic panel   Hypothyroidism, unspecified - Primary   Repeat Tsh and T4 Maintain med dose while waiting for lab results Wt Readings from Last 3 Encounters:  03/17/23 114 lb 6.4 oz (51.9 kg)  03/04/23 112 lb 6.4 oz (51 kg)  09/18/22 108 lb (49 kg)         Relevant Orders   T4, free   TSH   Comprehensive metabolic panel   Osteoporosis   Reports improved fosamax  dose compliance Repeat dexa scan 04/2022: femoral neck -3.2, hip -2.9, forearm -2.9  Advised to maintain fosamax  dose, calcium and vit. D-600mg /500IU Continue daily weight bearing exercise Repeat dexa scan      Relevant Orders   DG Bone Density   Polyp of colon   Last colonoscopy 2018 Cologaurd completed 2024: negative We discussed limitation of cologuard test and possible false negative. She agreed to schedule appointment with [facility] for repeat colonoscopy      Relevant Orders   Ambulatory referral to Gastroenterology   CBC   Return in about 6 months (around 09/14/2023) for Hypothyroidism, hyperlipidemia (fasting).     Roselie Mood, NP

## 2023-03-17 NOTE — Assessment & Plan Note (Signed)
 Repeat Tsh and T4 Maintain med dose while waiting for lab results Wt Readings from Last 3 Encounters:  03/17/23 114 lb 6.4 oz (51.9 kg)  03/04/23 112 lb 6.4 oz (51 kg)  09/18/22 108 lb (49 kg)

## 2023-03-18 ENCOUNTER — Other Ambulatory Visit: Payer: Self-pay | Admitting: Nurse Practitioner

## 2023-03-18 DIAGNOSIS — E039 Hypothyroidism, unspecified: Secondary | ICD-10-CM

## 2023-03-18 MED ORDER — LEVOTHYROXINE SODIUM 50 MCG PO TABS: 50.0000 ug | ORAL_TABLET | Freq: Every day | ORAL | 1 refills | Status: DC

## 2023-03-20 ENCOUNTER — Other Ambulatory Visit: Payer: Self-pay | Admitting: Nurse Practitioner

## 2023-03-20 DIAGNOSIS — M810 Age-related osteoporosis without current pathological fracture: Secondary | ICD-10-CM

## 2023-03-21 ENCOUNTER — Ambulatory Visit: Payer: Medicare PPO | Admitting: Nurse Practitioner

## 2023-04-16 ENCOUNTER — Encounter: Payer: Self-pay | Admitting: Nurse Practitioner

## 2023-04-21 ENCOUNTER — Encounter: Payer: Self-pay | Admitting: Nurse Practitioner

## 2023-05-02 ENCOUNTER — Ambulatory Visit: Payer: Self-pay | Admitting: Nurse Practitioner

## 2023-05-02 NOTE — Telephone Encounter (Signed)
 Red Word that prompted transfer to Nurse Triage:  Anxiety and worried about heart problems as well as shoulder pain.                 Chief Complaint: Having increased anxiety."I have anxiety for a long time."  Symptoms: Above, "heart races sometimes." Frequency: Months Pertinent Negatives: Patient denies any thoughts of self harm. Disposition: [] ED /[] Urgent Care (no appt availability in office) / [x] Appointment(In office/virtual)/ []  [facility]/ [] Home Care/ [] Refused Recommended Disposition /[] [facility]/ []  Follow-up with PCP Additional Notes: Pt. Agrees with appointment.  Reason for Disposition  [1] Symptoms of anxiety or panic attack AND [2] is a chronic symptom (recurrent or ongoing AND present > 4 weeks)  Answer Assessment - Initial Assessment Questions 1. CONCERN: "Did anything happen that prompted you to call today?"      Anxiety  2. ANXIETY SYMPTOMS: "Can you describe how you (your loved one; patient) have been feeling?" (e.g., tense, restless, panicky, anxious, keyed up, overwhelmed, sense of impending doom).      Overwhelmed 3. ONSET: "How long have you been feeling this way?" (e.g., hours, days, weeks)     Several weeks, getting worse 4. SEVERITY: "How would you rate the level of anxiety?" (e.g., 0 - 10; or mild, moderate, severe).     Moderate 5. FUNCTIONAL IMPAIRMENT: "How have these feelings affected your ability to do daily activities?" "Have you had more difficulty than usual doing your normal daily activities?" (e.g., getting better, same, worse; self-care, school, work, interactions)     Yes 6. HISTORY: "Have you felt this way before?" "Have you ever been diagnosed with an anxiety problem in the past?" (e.g., generalized anxiety disorder, panic attacks, PTSD). If Yes, ask: "How was this problem treated?" (e.g., medicines, counseling, etc.)     Yes 7. RISK OF HARM - SUICIDAL IDEATION: "Do you ever have thoughts of hurting or killing yourself?" If  Yes, ask:  "Do you have these feelings now?" "Do you have a plan on how you would do this?"     No 8. TREATMENT:  "What has been done so far to treat this anxiety?" (e.g., medicines, relaxation strategies). "What has helped?"     None 9. TREATMENT - THERAPIST: "Do you have a counselor or therapist? Name?"     No 10. POTENTIAL TRIGGERS: "Do you drink caffeinated beverages (e.g., coffee, colas, teas), and how much daily?" "Do you drink alcohol or use any drugs?" "Have you started any new medicines recently?"       No 11. PATIENT SUPPORT: "Who is with you now?" "Who do you live with?" "Do you have family or friends who you can talk to?"        No 12. OTHER SYMPTOMS: "Do you have any other symptoms?" (e.g., feeling depressed, trouble concentrating, trouble sleeping, trouble breathing, palpitations or fast heartbeat, chest pain, sweating, nausea, or diarrhea)       Heart racing 13. PREGNANCY: "Is there any chance you are pregnant?" "When was your last menstrual period?"       No  Protocols used: Anxiety and Panic Attack-A-AH

## 2023-05-02 NOTE — Telephone Encounter (Signed)
 Patient scheduled with Claris Gower on 05/13/2023

## 2023-05-09 ENCOUNTER — Ambulatory Visit: Admitting: Nurse Practitioner

## 2023-05-09 ENCOUNTER — Encounter: Payer: Self-pay | Admitting: Nurse Practitioner

## 2023-05-09 VITALS — BP 124/78 | HR 77 | Temp 98.2°F | Wt 113.4 lb

## 2023-05-09 DIAGNOSIS — M2550 Pain in unspecified joint: Secondary | ICD-10-CM

## 2023-05-09 DIAGNOSIS — M545 Low back pain, unspecified: Secondary | ICD-10-CM

## 2023-05-09 DIAGNOSIS — G8929 Other chronic pain: Secondary | ICD-10-CM | POA: Diagnosis not present

## 2023-05-09 DIAGNOSIS — R9431 Abnormal electrocardiogram [ECG] [EKG]: Secondary | ICD-10-CM

## 2023-05-09 DIAGNOSIS — Z1231 Encounter for screening mammogram for malignant neoplasm of breast: Secondary | ICD-10-CM

## 2023-05-09 DIAGNOSIS — M778 Other enthesopathies, not elsewhere classified: Secondary | ICD-10-CM | POA: Diagnosis not present

## 2023-05-09 DIAGNOSIS — R0789 Other chest pain: Secondary | ICD-10-CM | POA: Diagnosis not present

## 2023-05-09 NOTE — Assessment & Plan Note (Addendum)
 Chronic intermittent chest pain, describes as mid substernal chest pressure at rest, no known trigger, may last 5-51mins, resolves spontaneously Denies any pain this week No dizziness, no paresthesia, no weakness, no SOB, no GERD, no nausea She  opted not to schedule with cardiology last week, because pain had resolved. BP at goal, no DIABETES, no tobacco use, no ALCOHOL abuse. No known CAD. Hx of hyperlipidemia: LDL at 74, current use of zocor.  Repeat ECG today: NSR, possible LVH due to tall QRS complex, ST abnormality No change compared to 2024 ECG Ordered echocardiogram and another cardiology referral Advised about ED precautions.

## 2023-05-09 NOTE — Progress Notes (Signed)
 Established Patient Visit  Patient: Lindsay Ferguson   DOB: 15-Apr-1945   78 y.o. Female  MRN: [id number] Visit Date: 05/09/2023  Subjective:    Chief Complaint  Patient presents with   Shoulder Pain    Shoulder injury while painting the bathroom ceiling. Pulled right shoulder. Scheduled for mobile mammo on 3/17   Shoulder Pain  The pain is present in the right shoulder. This is a new problem. The current episode started 1 to 4 weeks ago. There has been no history of extremity trauma. The problem occurs constantly. The problem has been unchanged. The quality of the pain is described as aching. Associated symptoms include a limited range of motion and stiffness. Pertinent negatives include no fever, inability to bear weight, itching, joint locking, joint swelling, numbness or tingling. The symptoms are aggravated by activity and lying down. She has tried acetaminophen for the symptoms. The treatment provided mild relief. Family history does not include gout or rheumatoid arthritis. Her past medical history is significant for osteoarthritis. There is no history of diabetes, gout or rheumatoid arthritis.   Atypical chest pain Chronic intermittent chest pain, describes as mid substernal chest pressure at rest, no known trigger, may last 5-69mins, resolves spontaneously Denies any pain this week No dizziness, no paresthesia, no weakness, no SOB, no GERD, no nausea She  opted not to schedule with cardiology last week, because pain had resolved. BP at goal, no DIABETES, no tobacco use, no ALCOHOL abuse. No known CAD. Hx of hyperlipidemia: LDL at 74, current use of zocor.  Repeat ECG today: NSR, possible LVH due to tall QRS complex, ST abnormality No change compared to 2024 ECG Ordered echocardiogram and another cardiology referral Advised about ED precautions.  Reviewed medical, surgical, and social history today  Medications: Outpatient Medications Prior to Visit   Medication Sig   acyclovir (ZOVIRAX) 400 MG tablet TAKE 1 TABLET(400 MG) BY MOUTH FOUR TIMES DAILY AS NEEDED   alendronate (FOSAMAX) 10 MG tablet TAKE 1 TABLET(10 MG) BY MOUTH DAILY BEFORE BREAKFAST WITH A FULL GLASS OF WATER AND ON AN EMPTY STOMACH   aspirin EC 81 MG tablet Take 1 tablet (81 mg total) by mouth 2 (two) times daily. Swallow whole.   calcium carbonate (OSCAL) 1500 (600 Ca) MG TABS tablet Take 1 tablet by mouth 2 (two) times daily with a meal.   cholecalciferol (VITAMIN D3) 25 MCG (1000 UNIT) tablet Take 1,000 Units by mouth daily.   gabapentin (NEURONTIN) 300 MG capsule TAKE ONE CAPSULE BY MOUTH IN THE MORNING AND 2 CAPSULES IN THE EVENING   levothyroxine (SYNTHROID) 50 MCG tablet Take 1 tablet (50 mcg total) by mouth daily before breakfast.   meloxicam (MOBIC) 7.5 MG tablet TAKE 1 TABLET(7.5 MG) BY MOUTH DAILY WITH FOOD   simvastatin (ZOCOR) 20 MG tablet TAKE 1 TABLET(20 MG) BY MOUTH AT BEDTIME   [DISCONTINUED] lubiprostone (AMITIZA) 8 MCG capsule Take 1 capsule (8 mcg total) by mouth 2 (two) times daily with a meal.   No facility-administered medications prior to visit.   Reviewed past medical and social history.   ROS per HPI above      Objective:  BP 124/78   Pulse 77   Temp 98.2 F (36.8 C) (Temporal)   Wt 113 lb 6.4 oz (51.4 kg)   SpO2 97%   BMI 22.90 kg/m      Physical Exam Vitals and nursing note reviewed.  Cardiovascular:     Rate and Rhythm: Normal rate and regular rhythm.     Pulses: Normal pulses.     Heart sounds: Normal heart sounds.  Pulmonary:     Effort: Pulmonary effort is normal.     Breath sounds: Normal breath sounds.  Musculoskeletal:     Right shoulder: Tenderness and bony tenderness present. No swelling, effusion, laceration or crepitus. Normal range of motion. Normal strength. Normal pulse.     Left shoulder: Normal.     Right upper arm: Normal.     Cervical back: Normal.     Right lower leg: No edema.     Left lower leg: No  edema.  Neurological:     Mental Status: She is alert and oriented to person, place, and time.     No results found for any visits on 05/09/23.    Assessment & Plan:    Problem List Items Addressed This Visit     Atypical chest pain   Chronic intermittent chest pain, describes as mid substernal chest pressure at rest, no known trigger, may last 5-48mins, resolves spontaneously Denies any pain this week No dizziness, no paresthesia, no weakness, no SOB, no GERD, no nausea She  opted not to schedule with cardiology last week, because pain had resolved. BP at goal, no DIABETES, no tobacco use, no ALCOHOL abuse. No known CAD. Hx of hyperlipidemia: LDL at 74, current use of zocor.  Repeat ECG today: NSR, possible LVH due to tall QRS complex, ST abnormality No change compared to 2024 ECG Ordered echocardiogram and another cardiology referral Advised about ED precautions.      Relevant Orders   EKG 12-Lead (Completed)   Ambulatory referral to Cardiology   ECHOCARDIOGRAM COMPLETE   Chronic midline low back pain without sciatica   Chronic pain of multiple joints   Other Visit Diagnoses       Breast cancer screening by mammogram    -  Primary   Relevant Orders   MM DIGITAL SCREENING BILATERAL     Right shoulder tendonitis         Abnormal ECG       Relevant Orders   ECHOCARDIOGRAM COMPLETE      Return if symptoms worsen or fail to improve.     Alysia Penna, NP

## 2023-05-09 NOTE — Patient Instructions (Signed)
 Increase mobic to 7.5mg  2tabs daily with food x 7days, then resume 7.5mg  daily. Ok to use tylenol 650mg  BID. Alternate between warm and cold compress continuously Start shoulder exercise Avoid lifting or pushing Schedule appointment with cardiology  Shoulder Exercises Ask your health care provider which exercises are safe for you. Do exercises exactly as told by your health care provider and adjust them as directed. It is normal to feel mild stretching, pulling, tightness, or discomfort as you do these exercises. Stop right away if you feel sudden pain or your pain gets worse. Do not begin these exercises until told by your health care provider. Stretching exercises External rotation and abduction This exercise is sometimes called corner stretch. The exercise rotates your arm outward (external rotation) and moves your arm out from your body (abduction). Stand in a doorway with one of your feet slightly in front of the other. This is called a staggered stance. If you cannot reach your forearms to the door frame, stand facing a corner of a room. Choose one of the following positions as told by your health care provider: Place your hands and forearms on the door frame above your head. Place your hands and forearms on the door frame at the height of your head. Place your hands on the door frame at the height of your elbows. Slowly move your weight onto your front foot until you feel a stretch across your chest and in the front of your shoulders. Keep your head and chest upright and keep your abdominal muscles tight. Hold for __________ seconds. To release the stretch, shift your weight to your back foot. Repeat __________ times. Complete this exercise __________ times a day. Extension, standing  Stand and hold a broomstick, a cane, or a similar object behind your back. Your hands should be a little wider than shoulder-width apart. Your palms should face away from your back. Keeping your elbows  straight and your shoulder muscles relaxed, move the stick away from your body until you feel a stretch in your shoulders (extension). Avoid shrugging your shoulders while you move the stick. Keep your shoulder blades tucked down toward the middle of your back. Hold for __________ seconds. Slowly return to the starting position. Repeat __________ times. Complete this exercise __________ times a day. Range-of-motion exercises Pendulum  Stand near a wall or a surface that you can hold onto for balance. Bend at the waist and let your left / right arm hang straight down. Use your other arm to support you. Keep your back straight and do not lock your knees. Relax your left / right arm and shoulder muscles, and move your hips and your trunk so your left / right arm swings freely. Your arm should swing because of the motion of your body, not because you are using your arm or shoulder muscles. Keep moving your hips and trunk so your arm swings in the following directions, as told by your health care provider: Side to side. Forward and backward. In clockwise and counterclockwise circles. Continue each motion for __________ seconds, or for as long as told by your health care provider. Slowly return to the starting position. Repeat __________ times. Complete this exercise __________ times a day. Shoulder flexion, standing  Stand and hold a broomstick, a cane, or a similar object. Place your hands a little more than shoulder-width apart on the object. Your left / right hand should be palm-up, and your other hand should be palm-down. Keep your elbow straight and your shoulder muscles  relaxed. Push the stick up with your healthy arm to raise your left / right arm in front of your body, and then over your head until you feel a stretch in your shoulder (flexion). Avoid shrugging your shoulder while you raise your arm. Keep your shoulder blade tucked down toward the middle of your back. Hold for __________  seconds. Slowly return to the starting position. Repeat __________ times. Complete this exercise __________ times a day. Shoulder abduction, standing  Stand and hold a broomstick, a cane, or a similar object. Place your hands a little more than shoulder-width apart on the object. Your left / right hand should be palm-up, and your other hand should be palm-down. Keep your elbow straight and your shoulder muscles relaxed. Push the object across your body toward your left / right side. Raise your left / right arm to the side of your body (abduction) until you feel a stretch in your shoulder. Do not raise your arm above shoulder height unless your health care provider tells you to do that. If directed, raise your arm over your head. Avoid shrugging your shoulder while you raise your arm. Keep your shoulder blade tucked down toward the middle of your back. Hold for __________ seconds. Slowly return to the starting position. Repeat __________ times. Complete this exercise __________ times a day. Internal rotation  Place your left / right hand behind your back, palm-up. Use your other hand to dangle an exercise band, a broomstick, or a similar object over your shoulder. Grasp the band with your left / right hand so you are holding on to both ends. Gently pull up on the band until you feel a stretch in the front of your left / right shoulder. The movement of your arm toward the center of your body is called internal rotation. Avoid shrugging your shoulder while you raise your arm. Keep your shoulder blade tucked down toward the middle of your back. Hold for __________ seconds. Release the stretch by letting go of the band and lowering your hands. Repeat __________ times. Complete this exercise __________ times a day. Strengthening exercises External rotation  Sit in a stable chair without armrests. Secure an exercise band to a stable object at elbow height on your left / right side. Place a soft  object, such as a folded towel or a small pillow, between your left / right upper arm and your body to move your elbow about 4 inches (10 cm) away from your side. Hold the end of the exercise band so it is tight and there is no slack. Keeping your elbow pressed against the soft object, slowly move your forearm out, away from your abdomen (external rotation). Keep your body steady so only your forearm moves. Hold for __________ seconds. Slowly return to the starting position. Repeat __________ times. Complete this exercise __________ times a day. Shoulder abduction  Sit in a stable chair without armrests, or stand up. Hold a __________ lb / kg weight in your left / right hand, or hold an exercise band with both hands. Start with your arms straight down and your left / right palm facing in, toward your body. Slowly lift your left / right hand out to your side (abduction). Do not lift your hand above shoulder height unless your health care provider tells you that this is safe. Keep your arms straight. Avoid shrugging your shoulder while you do this movement. Keep your shoulder blade tucked down toward the middle of your back. Hold for __________ seconds.  Slowly lower your arm, and return to the starting position. Repeat __________ times. Complete this exercise __________ times a day. Shoulder extension  Sit in a stable chair without armrests, or stand up. Secure an exercise band to a stable object in front of you so it is at shoulder height. Hold one end of the exercise band in each hand. Straighten your elbows and lift your hands up to shoulder height. Squeeze your shoulder blades together as you pull your hands down to the sides of your thighs (extension). Stop when your hands are straight down by your sides. Do not let your hands go behind your body. Hold for __________ seconds. Slowly return to the starting position. Repeat __________ times. Complete this exercise __________ times a  day. Shoulder row  Sit in a stable chair without armrests, or stand up. Secure an exercise band to a stable object in front of you so it is at chest height. Hold one end of the exercise band in each hand. Position your palms so that your thumbs are facing the ceiling (neutral position). Bend each of your elbows to a 90-degree angle (right angle) and keep your upper arms at your sides. Step back or move the chair back until the band is tight and there is no slack. Slowly pull your elbows back behind you. Hold for __________ seconds. Slowly return to the starting position. Repeat __________ times. Complete this exercise __________ times a day. Shoulder press-ups  Sit in a stable chair that has armrests. Sit upright, with your feet flat on the floor. Put your hands on the armrests so your elbows are bent and your fingers are pointing forward. Your hands should be about even with the sides of your body. Push down on the armrests and use your arms to lift yourself off the chair. Straighten your elbows and lift yourself up as much as you comfortably can. Move your shoulder blades down, and avoid letting your shoulders move up toward your ears. Keep your feet on the ground. As you get stronger, your feet should support less of your body weight as you lift yourself up. Hold for __________ seconds. Slowly lower yourself back into the chair. Repeat __________ times. Complete this exercise __________ times a day. Wall push-ups  Stand so you are facing a stable wall. Your feet should be about one arm-length away from the wall. Lean forward and place your palms on the wall at shoulder height. Keep your feet flat on the floor as you bend your elbows and lean forward toward the wall. Hold for __________ seconds. Straighten your elbows to push yourself back to the starting position. Repeat __________ times. Complete this exercise __________ times a day. This information is not intended to replace  advice given to you by your health care provider. Make sure you discuss any questions you have with your health care provider. Document Revised: 04/10/2021 Document Reviewed: 04/10/2021 Elsevier Patient Education  2024 [employer].

## 2023-05-13 ENCOUNTER — Ambulatory Visit: Payer: Medicare PPO | Admitting: Nurse Practitioner

## 2023-05-19 ENCOUNTER — Other Ambulatory Visit: Payer: Self-pay | Admitting: Nurse Practitioner

## 2023-05-19 ENCOUNTER — Ambulatory Visit
Admission: RE | Admit: 2023-05-19 | Discharge: 2023-05-19 | Disposition: A | Discharge status: Home / Self Care | Source: Ambulatory Visit | Attending: Nurse Practitioner

## 2023-05-19 DIAGNOSIS — Z1231 Encounter for screening mammogram for malignant neoplasm of breast: Secondary | ICD-10-CM

## 2023-05-19 DIAGNOSIS — G8929 Other chronic pain: Secondary | ICD-10-CM

## 2023-05-19 DIAGNOSIS — M778 Other enthesopathies, not elsewhere classified: Secondary | ICD-10-CM

## 2023-05-19 DIAGNOSIS — R9431 Abnormal electrocardiogram [ECG] [EKG]: Secondary | ICD-10-CM

## 2023-05-19 DIAGNOSIS — R0789 Other chest pain: Secondary | ICD-10-CM

## 2023-06-02 ENCOUNTER — Ambulatory Visit (HOSPITAL_BASED_OUTPATIENT_CLINIC_OR_DEPARTMENT_OTHER)
Admission: RE | Admit: 2023-06-02 | Discharge: 2023-06-02 | Disposition: A | Discharge status: Home / Self Care | Source: Ambulatory Visit | Attending: Nurse Practitioner | Admitting: Nurse Practitioner

## 2023-06-02 DIAGNOSIS — R9431 Abnormal electrocardiogram [ECG] [EKG]: Secondary | ICD-10-CM | POA: Diagnosis not present

## 2023-06-02 DIAGNOSIS — R0789 Other chest pain: Secondary | ICD-10-CM | POA: Diagnosis not present

## 2023-06-02 LAB — ECHOCARDIOGRAM COMPLETE
AR max vel: 1.25 cm2
AV Area VTI: 1.16 cm2
AV Area mean vel: 1.17 cm2
AV Mean grad: 4 mmHg
AV Peak grad: 6.7 mmHg
AV Vena cont: 0.2 cm
Ao pk vel: 1.29 m/s
Area-P 1/2: 3.77 cm2
Calc EF: 58.3 %
MV M vel: 1.88 m/s
MV Peak grad: 14.1 mmHg
S' Lateral: 2.7 cm
Single Plane A2C EF: 56.4 %
Single Plane A4C EF: 56.5 %

## 2023-06-08 ENCOUNTER — Encounter: Payer: Self-pay | Admitting: Nurse Practitioner

## 2023-06-30 ENCOUNTER — Ambulatory Visit (INDEPENDENT_AMBULATORY_CARE_PROVIDER_SITE_OTHER): Payer: Medicare PPO

## 2023-06-30 ENCOUNTER — Ambulatory Visit: Admitting: Cardiology

## 2023-06-30 DIAGNOSIS — Z0000 Encounter for general adult medical examination without abnormal findings: Secondary | ICD-10-CM

## 2023-06-30 NOTE — Patient Instructions (Signed)
 Lindsay Ferguson , Thank you for taking time to come for your Medicare Wellness Visit. I appreciate your ongoing commitment to your health goals. Please review the following plan we discussed and let me know if I can assist you in the future.   Referrals/Orders/Follow-Ups/Clinician Recommendations: none  This is a list of the screening recommended for you and due dates:  Health Maintenance  Topic Date Due   Colon Cancer Screening  07/10/2021   COVID-19 Vaccine (7 - 2024-25 season) 05/29/2023   Flu Shot  10/03/2023   Mammogram  05/18/2024   Medicare Annual Wellness Visit  06/29/2024   Pneumonia Vaccine  Completed   DEXA scan (bone density measurement)  Completed   Hepatitis C Screening  Completed   Zoster (Shingles) Vaccine  Completed   HPV Vaccine  Aged Out   Meningitis B Vaccine  Aged Out   DTaP/Tdap/Td vaccine  Discontinued    Advanced directives: (Copy Requested) Please bring a copy of your health care power of attorney and living will to the office to be added to your chart at your convenience. You can mail to [facility] [address] [place], [place] [address] or email to [email or web address]  Next Medicare Annual Wellness Visit scheduled for next year: Yes  insert Preventive Care attachment Insert FALL PREVENTION attachment if needed

## 2023-06-30 NOTE — Progress Notes (Signed)
 Subjective:   Lindsay Ferguson is a 78 y.o. who presents for a Medicare Wellness preventive visit.  Visit Complete: Virtual I connected with  Lindsay Ferguson on 06/30/23 by a audio enabled telemedicine application and verified that I am speaking with the correct person using two identifiers.  Patient Location: Home  Provider Location: Office/Clinic  I discussed the limitations of evaluation and management by telemedicine. The patient expressed understanding and agreed to proceed.  Vital Signs: Because this visit was a virtual/telehealth visit, some criteria may be missing or patient reported. Any vitals not documented were not able to be obtained and vitals that have been documented are patient reported.  VideoError- Librarian, academic were attempted between this provider and patient, however failed, due to patient having technical difficulties OR patient did not have access to video capability.  We continued and completed visit with audio only.   Persons Participating in Visit: Patient.  AWV Questionnaire: No: Patient Medicare AWV questionnaire was not completed prior to this visit.  Cardiac Risk Factors include: advanced age (>67men, >62 women);dyslipidemia     Objective:    Today's Vitals   06/30/23 1610  PainSc: 4    There is no height or weight on file to calculate BMI.     06/30/2023    8:15 AM 06/28/2022    8:16 AM 06/20/2021   10:44 AM 05/28/2021    8:46 AM 12/27/2019    3:38 PM  Advanced Directives  Does Patient Have a Medical Advance Directive? Yes Yes No No No  Type of Estate agent of [place];Living will Healthcare Power of [place];Living will     Copy of Healthcare Power of Attorney in Chart? No - copy requested No - copy requested     Would patient like information on creating a medical advance directive?   No - Patient declined No - Patient declined Yes (MAU/Ambulatory/Procedural Areas - Information  given)    Current Medications (verified) Outpatient Encounter Medications as of 06/30/2023  Medication Sig   acyclovir  (ZOVIRAX ) 400 MG tablet TAKE 1 TABLET(400 MG) BY MOUTH FOUR TIMES DAILY AS NEEDED   alendronate  (FOSAMAX ) 10 MG tablet TAKE 1 TABLET(10 MG) BY MOUTH DAILY BEFORE BREAKFAST WITH A FULL GLASS OF WATER  AND ON AN EMPTY STOMACH   aspirin  EC 81 MG tablet Take 1 tablet (81 mg total) by mouth 2 (two) times daily. Swallow whole.   calcium carbonate (OSCAL) 1500 (600 Ca) MG TABS tablet Take 1 tablet by mouth 2 (two) times daily with a meal.   cholecalciferol (VITAMIN D3) 25 MCG (1000 UNIT) tablet Take 1,000 Units by mouth daily.   gabapentin  (NEURONTIN ) 300 MG capsule TAKE ONE CAPSULE BY MOUTH IN THE MORNING AND 2 CAPSULES IN THE EVENING   levothyroxine  (SYNTHROID ) 50 MCG tablet Take 1 tablet (50 mcg total) by mouth daily before breakfast.   meloxicam  (MOBIC ) 7.5 MG tablet TAKE 1 TABLET(7.5 MG) BY MOUTH DAILY WITH FOOD   simvastatin  (ZOCOR ) 20 MG tablet TAKE 1 TABLET(20 MG) BY MOUTH AT BEDTIME   No facility-administered encounter medications on file as of 06/30/2023.    Allergies (verified) Oxycodone , Prednisone, and Codeine   History: Past Medical History:  Diagnosis Date   Herpesviral infection, unspecified 11/29/2016   Hyperlipidemia    Hypothyroidism    Tear of lateral cartilage or meniscus of knee, current 10/12/2013   Unilateral primary osteoarthritis, left hip 04/04/2021   Past Surgical History:  Procedure Laterality Date   ABDOMINAL HYSTERECTOMY  FACIAL FRACTURE SURGERY     SPINE SURGERY  1988   TOTAL HIP ARTHROPLASTY Left 06/08/2021   Procedure: LEFT TOTAL HIP ARTHROPLASTY ANTERIOR APPROACH;  Surgeon: Arnie Lao, MD;  Location: [facility];  Service: Orthopedics;  Laterality: Left;  Needs RNFA   Family History  Problem Relation Age of Onset   Cancer Mother    Diabetes Father    Diabetes Sister    Stroke Sister    Breast cancer Sister    AAA  (abdominal aortic aneurysm) Sister    Social History   Socioeconomic History   Marital status: Married    Spouse name: Not on file   Number of children: Not on file   Years of education: Not on file   Highest education level: Some college, no degree  Occupational History   Not on file  Tobacco Use   Smoking status: Never   Smokeless tobacco: Never  Vaping Use   Vaping status: Never Used  Substance and Sexual Activity   Alcohol use: Not Currently    Comment: Occasionally (wine)   Drug use: Never   Sexual activity: Not Currently    Birth control/protection: Post-menopausal  Other Topics Concern   Not on file  Social History Narrative   Not on file   Social Drivers of Health   Financial Resource Strain: Low Risk  (06/30/2023)   Overall Financial Resource Strain (CARDIA)    Difficulty of Paying Living Expenses: Not hard at all  Food Insecurity: No Food Insecurity (06/30/2023)   Hunger Vital Sign    Worried About Running Out of Food in the Last Year: Never true    Ran Out of Food in the Last Year: Never true  Transportation Needs: No Transportation Needs (06/30/2023)   PRAPARE - Administrator, Civil Service (Medical): No    Lack of Transportation (Non-Medical): No  Physical Activity: Inactive (06/30/2023)   Exercise Vital Sign    Days of Exercise per Week: 0 days    Minutes of Exercise per Session: 0 min  Stress: No Stress Concern Present (06/30/2023)   [employer] of Occupational Health - Occupational Stress Questionnaire    Feeling of Stress : Not at all  Social Connections: Moderately Isolated (06/30/2023)   Social Connection and Isolation Panel [NHANES]    Frequency of Communication with Friends and Family: More than three times a week    Frequency of Social Gatherings with Friends and Family: Twice a week    Attends Religious Services: Never    Database administrator or Organizations: No    Attends Engineer, structural: Never    Marital  Status: Married    Tobacco Counseling Counseling given: Not Answered    Clinical Intake:  Pre-visit preparation completed: Yes  Pain : 0-10 Pain Score: 4  Pain Type: Chronic pain Pain Location: Generalized Pain Descriptors / Indicators: Aching Pain Onset: More than a month ago Pain Frequency: Intermittent     Nutritional Risks: None Diabetes: No  No results found for: "HGBA1C"   How often do you need to have someone help you when you read instructions, pamphlets, or other written materials from your doctor or pharmacy?: 1 - Never  Interpreter Needed?: No  Information entered by :: NAllen LPN   Activities of Daily Living     06/30/2023    8:08 AM  In your present state of health, do you have any difficulty performing the following activities:  Hearing? 0  Vision? 0  Difficulty  concentrating or making decisions? 0  Walking or climbing stairs? 0  Dressing or bathing? 0  Doing errands, shopping? 0  Preparing Food and eating ? N  Using the Toilet? N  In the past six months, have you accidently leaked urine? N  Do you have problems with loss of bowel control? N  Managing your Medications? N  Managing your Finances? N  Housekeeping or managing your Housekeeping? N    Patient Care Team: Nche, Connye Delaine, NP as PCP - [facility] (Internal [facility], [facility]  Indicate any recent Medical Services you may have received from other than [facility] providers in the past year (date may be approximate).     Assessment:   This is a routine wellness examination for Lindsay Ferguson.  Hearing/Vision screen Hearing Screening - Comments:: Denies hearing issues Vision Screening - Comments:: No regular eye exams   Goals Addressed             This Visit's Progress    Patient Stated       06/30/2023, wants to lose 2-3 pounds       Depression Screen     06/30/2023    8:17 AM 09/18/2022    8:20 AM 06/28/2022    8:17 AM 03/20/2022    9:49 AM 09/25/2021    8:48 AM  06/20/2021   10:47 AM 06/20/2021   10:42 AM  PHQ 2/9 Scores  PHQ - 2 Score 1 3 0 4 2 0 0  PHQ- 9 Score 3 8  14 10       Fall Risk     06/30/2023    8:16 AM 09/18/2022    8:20 AM 06/28/2022    8:17 AM 06/20/2021   10:45 AM 04/26/2021    8:29 AM  Fall Risk   Falls in the past year? 0 0 0 0 0  Number falls in past yr: 0 0 0 0 0  Injury with Fall? 0 0 0 0 0  Risk for fall due to : Medication side effect No Fall Risks Medication side effect Other (Comment) No Fall Risks  Risk for fall due to: Comment    recovering hip replacement   Follow up Falls prevention discussed;Falls evaluation completed Falls evaluation completed Falls prevention discussed;Education provided;Falls evaluation completed Falls evaluation completed;Education provided Falls evaluation completed    MEDICARE RISK AT HOME:  Medicare Risk at Home Any stairs in or around the home?: Yes If so, are there any without handrails?: No Home free of loose throw rugs in walkways, pet beds, electrical cords, etc?: Yes Adequate lighting in your home to reduce risk of falls?: Yes Life alert?: No Use of a cane, walker or w/c?: No Grab bars in the bathroom?: No Shower chair or bench in shower?: Yes Elevated toilet seat or a handicapped toilet?: No  TIMED UP AND GO:  Was the test performed?  No  Cognitive Function: 6CIT completed        06/30/2023    8:19 AM 06/28/2022    8:18 AM  6CIT Screen  What Year? 0 points 0 points  What month? 0 points 0 points  What time? 0 points 0 points  Count back from 20 0 points 0 points  Months in reverse 0 points 0 points  Repeat phrase 2 points 0 points  Total Score 2 points 0 points    Immunizations Immunization History  Administered Date(s) Administered   Influenza, High Dose Seasonal PF 12/06/2014, 11/23/2017, 12/02/2017, 11/20/2018   Influenza,inj,Quad PF,6+ Mos 12/06/2014  Influenza-Unspecified 11/29/2013, 12/06/2014, 11/26/2019, 12/11/2020, 11/29/2022   PFIZER(Purple  Top)SARS-COV-2 Vaccination 05/06/2019, 06/05/2019, 01/21/2020, 07/13/2020, 12/11/2020   Pneumococcal Conjugate-13 08/11/2018   Pneumococcal Polysaccharide-23 04/24/2012, 04/24/2012, 06/19/2016, 06/19/2016   RSV,unspecified 02/05/2023   Unspecified SARS-COV-2 Vaccination 11/29/2022   Zoster Recombinant(Shingrix ) 02/05/2023, 04/19/2023   Zoster, Live 02/21/2011, 02/21/2011   Zoster, Unspecified 02/05/2023    Screening Tests Health Maintenance  Topic Date Due   Colonoscopy  07/10/2021   COVID-19 Vaccine (7 - 2024-25 season) 05/29/2023   INFLUENZA VACCINE  10/03/2023   MAMMOGRAM  05/18/2024   Medicare Annual Wellness (AWV)  06/29/2024   Pneumonia Vaccine 11+ Years old  Completed   DEXA SCAN  Completed   Hepatitis C Screening  Completed   Zoster Vaccines- Shingrix   Completed   HPV VACCINES  Aged Out   Meningococcal B Vaccine  Aged Out   DTaP/Tdap/Td  Discontinued    Health Maintenance  Health Maintenance Due  Topic Date Due   Colonoscopy  07/10/2021   COVID-19 Vaccine (7 - 2024-25 season) 05/29/2023   Health Maintenance Items Addressed: Completed cologuard 09/24/2022 was negative.  Additional Screening:  Vision Screening: Recommended annual ophthalmology exams for early detection of glaucoma and other disorders of the eye.  Dental Screening: Recommended annual dental exams for proper oral hygiene  Community Resource Referral / Chronic Care Management: CRR required this visit?  No   CCM required this visit?  No     Plan:     I have personally reviewed and noted the following in the patient's chart:   Medical and social history Use of alcohol, tobacco or illicit drugs  Current medications and supplements including opioid prescriptions. Patient is not currently taking opioid prescriptions. Functional ability and status Nutritional status Physical activity Advanced directives List of other physicians Hospitalizations, surgeries, and ER visits in previous 12  months Vitals Screenings to include cognitive, depression, and falls Referrals and appointments  In addition, I have reviewed and discussed with patient certain preventive protocols, quality metrics, and best practice recommendations. A written personalized care plan for preventive services as well as general preventive health recommendations were provided to patient.     Areatha Beecham, LPN   [id number]/[id number]   After Visit Summary: (MyChart) Due to this being a telephonic visit, the after visit summary with patients personalized plan was offered to patient via MyChart   Notes: Nothing significant to report at this time.

## 2023-07-04 ENCOUNTER — Ambulatory Visit: Admitting: Cardiology

## 2023-07-10 ENCOUNTER — Ambulatory Visit

## 2023-07-17 ENCOUNTER — Ambulatory Visit

## 2023-07-17 VITALS — BP 140/60 | HR 73 | Ht 59.0 in | Wt 113.0 lb

## 2023-07-17 DIAGNOSIS — R030 Elevated blood-pressure reading, without diagnosis of hypertension: Secondary | ICD-10-CM | POA: Diagnosis not present

## 2023-07-17 DIAGNOSIS — I493 Ventricular premature depolarization: Secondary | ICD-10-CM | POA: Diagnosis not present

## 2023-07-17 DIAGNOSIS — I519 Heart disease, unspecified: Secondary | ICD-10-CM

## 2023-07-17 DIAGNOSIS — R0789 Other chest pain: Secondary | ICD-10-CM | POA: Diagnosis not present

## 2023-07-17 DIAGNOSIS — I447 Left bundle-branch block, unspecified: Secondary | ICD-10-CM

## 2023-07-17 DIAGNOSIS — E782 Mixed hyperlipidemia: Secondary | ICD-10-CM

## 2023-07-17 MED ORDER — METOPROLOL TARTRATE 100 MG PO TABS: 100.0000 mg | ORAL_TABLET | Freq: Once | ORAL | 0 refills | Status: DC

## 2023-07-17 NOTE — Assessment & Plan Note (Signed)
 Atypical chronic ongoing chest pain for many months can last for minutes.  LV dysfunction in the past with EF 45% as per prior cardiology notes from 2015. Now with low normal LVEF on recent echocardiogram March 2025.  Clinically not in heart failure. Good functional status but no regular exercise.  Will proceed for evaluation of coronary artery disease with cardiac CT coronary angiogram. Alternative approaches of stress test discussed. However in the context of LBBB and osteoarthritis, limits functional assessment with stress test.  Discussed use of IV contrast, no allergy to contrast reported.  She is scared of needles but willing to proceed with the test.  Continue aspirin  81 mg once daily. Will review test results once available.

## 2023-07-17 NOTE — Progress Notes (Signed)
 Cardiology Consultation:    Date:  07/17/2023   ID:  Lindsay Ferguson, DOB September 18, 1945, MRN [id number]  PCP:  Kandace Organ, NP  Cardiologist:  Daymon Evans Kara Melching, MD   Referring MD: Kandace Organ, NP   Chief Complaint  Patient presents with   Chest Pain     ASSESSMENT AND PLAN:   [name] 78 year old woman with history of hypothyroidism, hyperlipidemia, left bundle branch block noted on EKG since 2023 at least and prior reported history of LV dysfunction EF 45% in 2015 with recent LVEF low normal 50 to 55% on echocardiogram June 02, 2023 here for further evaluation of chest pressure and discomfort and noted to have sinus rhythm with left bundle branch block morphology and frequent PVCs on EKG today and elevated blood pressures without any prior diagnosis of hypertension.  Problem List Items Addressed This Visit     Hyperlipidemia   Last lipid panel available to review is from 03-17-2023 Total cholesterol 182, HDL 93, LDL 74, triglycerides 70. Continue current medication simvastatin  20 mg once daily.       Relevant Medications   metoprolol tartrate (LOPRESSOR) 100 MG tablet   Other Relevant Orders   Basic Metabolic Panel (BMET)   Atypical chest pain - Primary   Atypical chronic ongoing chest pain for many months can last for minutes.  LV dysfunction in the past with EF 45% as per prior cardiology notes from 2015. Now with low normal LVEF on recent echocardiogram March 2025.  Clinically not in heart failure. Good functional status but no regular exercise.  Will proceed for evaluation of coronary artery disease with cardiac CT coronary angiogram. Alternative approaches of stress test discussed. However in the context of LBBB and osteoarthritis, limits functional assessment with stress test.  Discussed use of IV contrast, no allergy to contrast reported.  She is scared of needles but willing to proceed with the test.  Continue aspirin  81 mg once  daily. Will review test results once available.       Relevant Orders   EKG 12-Lead (Completed)   CT CORONARY MORPH W/CTA COR W/SCORE W/CA W/CM &/OR WO/CM   LONG TERM MONITOR (3-14 DAYS)   Basic Metabolic Panel (BMET)   Elevated blood pressure reading in office without diagnosis of hypertension   Elevated blood pressures today in the office.  Advised to monitor at home if possible and if consistently above 130/80 mmHg will need to be started on antihypertensive medication.      Relevant Orders   Basic Metabolic Panel (BMET)   Frequent PVCs   Frequent PVCs at times symptomatic as skipped beat or extra beat. Reports back-to-back extra beats at times. No lightheadedness dizziness or syncopal episodes.  Will obtain a Zio patch for 14 days to assess for overall ectopy burden and any other significant underlying arrhythmias.       Relevant Medications   metoprolol tartrate (LOPRESSOR) 100 MG tablet   Other Relevant Orders   Basic Metabolic Panel (BMET)   Left bundle branch block   Unclear if this is chronic but noticed on prior EKG in 2023. From cardiac structure and function standpoint appears to show low normal EF 50 to 55%. Only prior ischemic workup was reportedly a regular stress test that was normal as noted on cardiology notes from 2015 I do not have results of this.  Further coronary artery disease workup as discussed below under LV dysfunction with cardiac CT coronary imaging.      Relevant Medications  metoprolol tartrate (LOPRESSOR) 100 MG tablet   Other Relevant Orders   Basic Metabolic Panel (BMET)   LV dysfunction   Prior LV dysfunction 45% EF on cardiology notes from 2015. Recent echocardiogram 06-02-2023 EF 50 to 55%, grade 1 diastolic dysfunction. Frequent PVCs on EKG patient appeared to be symptomatic at times.  Now with ongoing chest discomfort symptoms we will proceed with further evaluation for any significant underlying coronary artery disease with  cardiac CT coronary imaging.      Relevant Medications   metoprolol tartrate (LOPRESSOR) 100 MG tablet   Other Relevant Orders   Basic Metabolic Panel (BMET)   Return to clinic tentatively in 2 months.   History of Present Illness:    Lindsay Ferguson is a 78 y.o. female who is being seen today for the evaluation of chest pain at the request of Nche, Connye Delaine, NP. Pleasant woman here for the visit by herself.  Lives with her husband at home who frequently travels.  She is by herself most of the time and keeps herself busy with day-to-day activities and garden work.  With reported history of LV dysfunction EF 45% in the past based on cardiology notes from 2015; however recent echocardiogramMarch 31, 2025 LVEF 50 to 55% with grade 1 diastolic dysfunction and no significant valve abnormalities. Also has a history of hypothyroidism, hyperlipidemia, left bundle branch block.  She mentions remote history of chest pain about 10 to 15 years ago overnight which is left through and following day had an EKG PCPs office and was told EKG shows abnormal findings like a prior heart attack.  No further follow-up testing or imaging per her history. About 10 years ago workup in [facility]  did mention LV dysfunction and normal stress test on cardiology notes I do not have these test results to review myself.  Mentions for many months she has been having symptoms of chest discomfort typically towards the end of the day which can happen randomly and can last for minutes.  Denies any lightheadedness or dizziness associated with this.  Denies any syncopal or near syncopal episodes. No obvious triggers or relieving factors. Associated with sense of shortness of breath.  No orthopnea.  No paroxysmal nocturnal dyspnea. Sleep is disturbed due to significant osteoarthritis related pain.  Also describes symptoms of skipped beat on extra beat that occurs randomly and sometimes back-to-back  lasting for minutes.  Does have Kardia mobile and blood pressure monitoring devices at home but she has not been using any.  Mentions she typically keeps herself going throughout the day working in the house and in the yard and playing with her dog.  Does not routinely exercise due to osteoarthritis related pain. Mentions she is an anxious kind of person and worries a lot.  Never smoked. Drinks alcohol occasionally 1 drink. No illicit drug use.  Has been taking medications consistently including aspirin  and simvastatin .  Her older sibling has history of significant smoking and COPD and abdominal aortic aneurysm.   EKG in the clinic today Shows sinus rhythm heart rate 73/min, frequent PVCs, normal PR interval 164 ms, QRS duration 124 ms LBBB morphology.  In comparison to prior EKG from May 28, 2021 PVCs are now noted. Past Medical History:  Diagnosis Date   Atypical chest pain 03/20/2022   Caregiver stress 03/20/2022   Chronic idiopathic constipation 12/27/2019   Chronic midline low back pain without sciatica 12/27/2019   Chronic pain of multiple joints 12/27/2019   Heart chamber dysfunction 09/09/2013  Herpesviral infection, unspecified 11/29/2016   HSV infection 11/29/2016   Hyperlipidemia    Hypothyroidism    Hypothyroidism, unspecified 11/29/2016   Osteoporosis 02/21/2011   Palpitations 09/09/2013   Polyp of colon 03/17/2023   Primary osteoarthritis of both hips 03/21/2021   Primary osteoarthritis of right knee 10/12/2013   Status post arthroscopic surgery of right knee 12/02/2013   Status post total replacement of left hip 06/25/2021   Tear of lateral cartilage or meniscus of knee, current 10/12/2013   Unilateral primary osteoarthritis, left hip 04/04/2021    Past Surgical History:  Procedure Laterality Date   ABDOMINAL HYSTERECTOMY     FACIAL FRACTURE SURGERY     SPINE SURGERY  1988   TOTAL HIP ARTHROPLASTY Left 06/08/2021   Procedure: LEFT TOTAL HIP ARTHROPLASTY  ANTERIOR APPROACH;  Surgeon: Arnie Lao, MD;  Location: [facility];  Service: Orthopedics;  Laterality: Left;  Needs RNFA    Current Medications: Current Meds  Medication Sig   acyclovir  (ZOVIRAX ) 400 MG tablet TAKE 1 TABLET(400 MG) BY MOUTH FOUR TIMES DAILY AS NEEDED   alendronate  (FOSAMAX ) 10 MG tablet TAKE 1 TABLET(10 MG) BY MOUTH DAILY BEFORE BREAKFAST WITH A FULL GLASS OF WATER  AND ON AN EMPTY STOMACH   aspirin  EC 81 MG tablet Take 1 tablet (81 mg total) by mouth 2 (two) times daily. Swallow whole.   calcium carbonate (OSCAL) 1500 (600 Ca) MG TABS tablet Take 1 tablet by mouth 2 (two) times daily with a meal.   cholecalciferol (VITAMIN D3) 25 MCG (1000 UNIT) tablet Take 1,000 Units by mouth daily.   gabapentin  (NEURONTIN ) 300 MG capsule TAKE ONE CAPSULE BY MOUTH IN THE MORNING AND 2 CAPSULES IN THE EVENING   levothyroxine  (SYNTHROID ) 50 MCG tablet Take 1 tablet (50 mcg total) by mouth daily before breakfast.   meloxicam  (MOBIC ) 7.5 MG tablet TAKE 1 TABLET(7.5 MG) BY MOUTH DAILY WITH FOOD   metoprolol tartrate (LOPRESSOR) 100 MG tablet Take 1 tablet (100 mg total) by mouth once for 1 dose. Please take this medication 2 hours before CT   simvastatin  (ZOCOR ) 20 MG tablet TAKE 1 TABLET(20 MG) BY MOUTH AT BEDTIME     Allergies:   Oxycodone , Prednisone, and Codeine   Social History   Socioeconomic History   Marital status: Married    Spouse name: Not on file   Number of children: Not on file   Years of education: Not on file   Highest education level: Some college, no degree  Occupational History   Not on file  Tobacco Use   Smoking status: Never   Smokeless tobacco: Never  Vaping Use   Vaping status: Never Used  Substance and Sexual Activity   Alcohol use: Not Currently    Comment: Occasionally (wine)   Drug use: Never   Sexual activity: Not Currently    Birth control/protection: Post-menopausal  Other Topics Concern   Not on file  Social History Narrative   Not  on file   Social Drivers of Health   Financial Resource Strain: Low Risk  (06/30/2023)   Overall Financial Resource Strain (CARDIA)    Difficulty of Paying Living Expenses: Not hard at all  Food Insecurity: No Food Insecurity (06/30/2023)   Hunger Vital Sign    Worried About Running Out of Food in the Last Year: Never true    Ran Out of Food in the Last Year: Never true  Transportation Needs: No Transportation Needs (06/30/2023)   PRAPARE - Transportation    Lack  of Transportation (Medical): No    Lack of Transportation (Non-Medical): No  Physical Activity: Inactive (06/30/2023)   Exercise Vital Sign    Days of Exercise per Week: 0 days    Minutes of Exercise per Session: 0 min  Stress: No Stress Concern Present (06/30/2023)   [employer] of Occupational Health - Occupational Stress Questionnaire    Feeling of Stress : Not at all  Social Connections: Moderately Isolated (06/30/2023)   Social Connection and Isolation Panel [NHANES]    Frequency of Communication with Friends and Family: More than three times a week    Frequency of Social Gatherings with Friends and Family: Twice a week    Attends Religious Services: Never    Database administrator or Organizations: No    Attends Engineer, structural: Never    Marital Status: Married     Family History: The patient's family history includes AAA (abdominal aortic aneurysm) in her sister; Breast cancer in her sister; Cancer in her mother; Diabetes in her father and sister; Stroke in her sister. ROS:   Please see the history of present illness.    All 14 point review of systems negative except as described per history of present illness.  EKGs/Labs/Other Studies Reviewed:    The following studies were reviewed today:   EKG:  EKG Interpretation Date/Time:  Thursday Jul 17 2023 08:51:03 EDT Ventricular Rate:  73 PR Interval:  164 QRS Duration:  124 QT Interval:  408 QTC Calculation: 449 R Axis:   -67  Text  Interpretation: Sinus rhythm with frequent Premature ventricular complexes Left axis deviation Left bundle branch block When compared with ECG of 28-May-2021 09:10, Premature ventricular complexes are now Present Left bundle branch block has replaced Incomplete right bundle branch block Criteria for Anteroseptal infarct are no longer Present Confirmed by Bertha Broad reddy [id number]) on 07/17/2023 9:00:07 AM    Recent Labs: 03/17/2023: ALT 15; BUN 8; Creatinine, Ser 0.66; Hemoglobin 13.1; Platelets 187.0; Potassium 4.3; Sodium 139; TSH 1.65  Recent Lipid Panel    Component Value Date/Time   CHOL 182 03/17/2023 0914   TRIG 70.0 03/17/2023 0914   HDL 93.50 03/17/2023 0914   CHOLHDL 2 03/17/2023 0914   VLDL 14.0 03/17/2023 0914   LDLCALC 74 03/17/2023 0914    Physical Exam:    VS:  BP (!) 140/60   Pulse 73   Ht 4\' 11"  (1.499 m)   Wt 113 lb (51.3 kg)   SpO2 97%   BMI 22.82 kg/m     Wt Readings from Last 3 Encounters:  07/17/23 113 lb (51.3 kg)  05/09/23 113 lb 6.4 oz (51.4 kg)  03/17/23 114 lb 6.4 oz (51.9 kg)     GENERAL:  Well nourished, well developed in no acute distress NECK: No JVD; No carotid bruits CARDIAC: RRR, S1 and S2 present, no murmurs, no rubs, no gallops CHEST:  Clear to auscultation without rales, wheezing or rhonchi  Extremities: No pitting pedal edema. Pulses bilaterally symmetric with radial 2+ and dorsalis pedis 2+ NEUROLOGIC:  Alert and oriented x 3  Medication Adjustments/Labs and Tests Ordered: Current medicines are reviewed at length with the patient today.  Concerns regarding medicines are outlined above.  Orders Placed This Encounter  Procedures   CT CORONARY MORPH W/CTA COR W/SCORE W/CA W/CM &/OR WO/CM   Basic Metabolic Panel (BMET)   LONG TERM MONITOR (3-14 DAYS)   EKG 12-Lead   Meds ordered this encounter  Medications   metoprolol tartrate (LOPRESSOR)  100 MG tablet    Sig: Take 1 tablet (100 mg total) by mouth once for 1 dose. Please take  this medication 2 hours before CT    Dispense:  1 tablet    Refill:  0    Signed, Earlene Bjelland reddy Lilyrose Tanney, MD, MPH, [facility]. 07/17/2023 9:26 AM    [facility]

## 2023-07-17 NOTE — Assessment & Plan Note (Signed)
 Last lipid panel available to review is from 03-17-2023 Total cholesterol 182, HDL 93, LDL 74, triglycerides 70. Continue current medication simvastatin  20 mg once daily.

## 2023-07-17 NOTE — Assessment & Plan Note (Signed)
 Prior LV dysfunction 45% EF on cardiology notes from 2015. Recent echocardiogram 06-02-2023 EF 50 to 55%, grade 1 diastolic dysfunction. Frequent PVCs on EKG patient appeared to be symptomatic at times.  Now with ongoing chest discomfort symptoms we will proceed with further evaluation for any significant underlying coronary artery disease with cardiac CT coronary imaging.

## 2023-07-17 NOTE — Assessment & Plan Note (Signed)
 Elevated blood pressures today in the office.  Advised to monitor at home if possible and if consistently above 130/80 mmHg will need to be started on antihypertensive medication.

## 2023-07-17 NOTE — Assessment & Plan Note (Signed)
 Frequent PVCs at times symptomatic as skipped beat or extra beat. Reports back-to-back extra beats at times. No lightheadedness dizziness or syncopal episodes.  Will obtain a Zio patch for 14 days to assess for overall ectopy burden and any other significant underlying arrhythmias.

## 2023-07-17 NOTE — Patient Instructions (Signed)
 Medication Instructions:  Your physician recommends that you continue on your current medications as directed. Please refer to the Current Medication list given to you today.  *If you need a refill on your cardiac medications before your next appointment, please call your pharmacy*  Lab Work: None If you have labs (blood work) drawn today and your tests are completely normal, you will receive your results only by: MyChart Message (if you have MyChart) OR A paper copy in the mail If you have any lab test that is abnormal or we need to change your treatment, we will call you to review the results.  Testing/Procedures: A zio monitor was ordered today. It will remain on for 14 days. You will then return monitor and event diary in provided box. It takes 1-2 weeks for report to be downloaded and returned to us . We will call you with the results. If monitor falls off or has orange flashing light, please call Zio for further instructions.     Your cardiac CT will be scheduled at one of the below locations:   [facility] [address] [place], [place] [address] [phone]  OR  [facility] [address] [place], [place] [address] [phone]  OR   [facility] [address] [place], [place] [address] [phone]  OR   [facility] [address] [place], [place] [address] [phone]  OR   [facility] and Vascular Tower [address]  [place], [place] [address] Opening June 30, 2023  If scheduled at [facility], please arrive at the [facility] and Children's Entrance (Entrance C2) of [facility] 30 minutes prior to test start time. You can use the FREE valet parking offered at entrance C (encouraged to control the heart rate for the test)  Proceed to the [facility] Radiology Department (first floor) to check-in and test prep.   All radiology  patients and guests should use entrance C2 at [facility], accessed from [facility], even though the hospital's physical address listed is [address]    If scheduled at the Heart and Vascular Tower at [address], please enter the parking lot using the [address] entrance and use the FREE valet service at the patient drop-off area. Enter the buidling and check-in with registration on the main floor.  If scheduled at [facility] or [facility], please arrive 15 mins early for check-in and test prep.  There is spacious parking and easy access to the radiology department from the [facility] Heart and Vascular entrance. Please enter here and check-in with the desk attendant.   If scheduled at [facility], please arrive 30 minutes early for check-in and test prep.  Please follow these instructions carefully (unless otherwise directed):  An IV will be required for this test and Nitroglycerin will be given.  Hold all erectile dysfunction medications at least 3 days (72 hrs) prior to test. (Ie viagra, cialis, sildenafil, tadalafil, etc)   On the Night Before the Test: Be sure to Drink plenty of water . Do not consume any caffeinated/decaffeinated beverages or chocolate 12 hours prior to your test. Do not take any antihistamines 12 hours prior to your test.  On the Day of the Test: Drink plenty of water  until 1 hour prior to the test. Do not eat any food 1 hour prior to test. You may take your regular medications  prior to the test.  Take metoprolol (Lopressor) two hours prior to test. Patients who wear a continuous glucose monitor MUST remove the device prior to scanning. FEMALES- please wear underwire-free bra if available, avoid dresses & tight clothing      After the Test: Drink plenty of water . After receiving IV contrast, you may experience a mild flushed feeling. This is normal. On occasion, you  may experience a mild rash up to 24 hours after the test. This is not dangerous. If this occurs, you can take Benadryl 25 mg, Zyrtec, Claritin, or Allegra and increase your fluid intake. (Patients taking Tikosyn should avoid Benadryl, and may take Zyrtec, Claritin, or Allegra) If you experience trouble breathing, this can be serious. If it is severe call 911 IMMEDIATELY. If it is mild, please call our office.  We will call to schedule your test 2-4 weeks out understanding that some insurance companies will need an authorization prior to the service being performed.   For more information and frequently asked questions, please visit our website : [email or web address]  For non-scheduling related questions, please contact the cardiac imaging nurse navigator should you have any questions/concerns: Cardiac Imaging Nurse Navigators Direct Office Dial: [phone]   For scheduling needs, including cancellations and rescheduling, please call [place], [phone].   Follow-Up: At [facility], you and your health needs are our priority.  As part of our continuing mission to provide you with exceptional heart care, our providers are all part of one team.  This team includes your primary Cardiologist (physician) and Advanced Practice Providers or APPs (Physician Assistants and Nurse Practitioners) who all work together to provide you with the care you need, when you need it.  Your next appointment:   2 month(s)  Provider:   Bertha Broad, MD    We recommend signing up for the patient portal called "MyChart".  Sign up information is provided on this After Visit Summary.  MyChart is used to connect with patients for Virtual Visits (Telemedicine).  Patients are able to view lab/test results, encounter notes, upcoming appointments, etc.  Non-urgent messages can be sent to your provider as well.   To learn more about what you can do with MyChart, go to [email or web address].    Other Instructions None

## 2023-07-17 NOTE — Assessment & Plan Note (Signed)
 Unclear if this is chronic but noticed on prior EKG in 2023. From cardiac structure and function standpoint appears to show low normal EF 50 to 55%. Only prior ischemic workup was reportedly a regular stress test that was normal as noted on cardiology notes from 2015 I do not have results of this.  Further coronary artery disease workup as discussed below under LV dysfunction with cardiac CT coronary imaging.

## 2023-08-05 DIAGNOSIS — R0789 Other chest pain: Secondary | ICD-10-CM | POA: Diagnosis not present

## 2023-08-14 ENCOUNTER — Telehealth: Payer: Self-pay

## 2023-08-14 NOTE — Telephone Encounter (Signed)
 error

## 2023-08-15 ENCOUNTER — Encounter (HOSPITAL_COMMUNITY): Payer: Self-pay

## 2023-08-18 ENCOUNTER — Telehealth (HOSPITAL_COMMUNITY): Payer: Self-pay | Admitting: *Deleted

## 2023-08-18 NOTE — Telephone Encounter (Signed)
 Reaching out to patient to offer assistance regarding upcoming cardiac imaging study; pt verbalizes understanding of appt date/time, parking situation and where to check in, pre-test NPO status and medications ordered, and verified current allergies; name and call back number provided for further questions should they arise Johney Frame RN Navigator Cardiac Imaging [facility] [phone] office [phone] cell

## 2023-08-19 ENCOUNTER — Ambulatory Visit (HOSPITAL_BASED_OUTPATIENT_CLINIC_OR_DEPARTMENT_OTHER)
Admission: RE | Admit: 2023-08-19 | Discharge: 2023-08-19 | Disposition: A | Discharge status: Home / Self Care | Source: Ambulatory Visit

## 2023-08-19 ENCOUNTER — Ambulatory Visit: Payer: Self-pay

## 2023-08-19 DIAGNOSIS — I493 Ventricular premature depolarization: Secondary | ICD-10-CM

## 2023-08-19 DIAGNOSIS — R0789 Other chest pain: Secondary | ICD-10-CM | POA: Insufficient documentation

## 2023-08-19 DIAGNOSIS — I2510 Atherosclerotic heart disease of native coronary artery without angina pectoris: Secondary | ICD-10-CM | POA: Insufficient documentation

## 2023-08-19 MED ORDER — METOPROLOL TARTRATE 5 MG/5ML IV SOLN: 10.0000 mg | Freq: Once | INTRAVENOUS | Status: DC | PRN

## 2023-08-19 MED ORDER — NITROGLYCERIN 0.4 MG SL SUBL
0.8000 mg | SUBLINGUAL_TABLET | Freq: Once | SUBLINGUAL | Status: AC
  Administered 2023-08-19: 0.8 mg via SUBLINGUAL

## 2023-08-19 MED ORDER — IOHEXOL 350 MG/ML SOLN
100.0000 mL | Freq: Once | INTRAVENOUS | Status: AC | PRN
Start: 2023-08-19 — End: 2023-08-19
  Administered 2023-08-19: 95 mL via INTRAVENOUS

## 2023-08-19 MED ORDER — DILTIAZEM HCL 25 MG/5ML IV SOLN: 10.0000 mg | INTRAVENOUS | Status: DC | PRN

## 2023-08-20 MED ORDER — METOPROLOL TARTRATE 25 MG PO TABS: 12.5000 mg | ORAL_TABLET | Freq: Two times a day (BID) | ORAL | 3 refills | Status: DC

## 2023-08-20 NOTE — Telephone Encounter (Signed)
 Results reviewed with pt as per Dr. Madireddy's note.  Pt verbalized understanding and had no additional questions. Routed to PCP

## 2023-08-20 NOTE — Telephone Encounter (Signed)
-----   Message from [place] R Madireddy sent at 08/19/2023  6:56 PM EDT ----- Occasional ventricular ectopy burden up to 4.2%. I sent a message to her on MyChart. Would recommend starting low-dose metoprolol  tartrate 12 5 mg twice daily, please send out this prescription if she is agreeable. Thank you  ----- Message ----- From: Angelena Kells, MD Sent: 08/19/2023   6:53 PM EDT To: Angelena Kells, MD

## 2023-08-22 ENCOUNTER — Other Ambulatory Visit: Payer: Self-pay | Admitting: Nurse Practitioner

## 2023-08-22 DIAGNOSIS — G8929 Other chronic pain: Secondary | ICD-10-CM

## 2023-08-22 DIAGNOSIS — M545 Low back pain, unspecified: Secondary | ICD-10-CM

## 2023-08-22 DIAGNOSIS — E782 Mixed hyperlipidemia: Secondary | ICD-10-CM

## 2023-09-16 ENCOUNTER — Ambulatory Visit

## 2023-09-16 VITALS — BP 120/74 | HR 68 | Ht 59.0 in | Wt 112.0 lb

## 2023-09-16 DIAGNOSIS — I493 Ventricular premature depolarization: Secondary | ICD-10-CM

## 2023-09-16 DIAGNOSIS — R030 Elevated blood-pressure reading, without diagnosis of hypertension: Secondary | ICD-10-CM

## 2023-09-16 DIAGNOSIS — I519 Heart disease, unspecified: Secondary | ICD-10-CM | POA: Diagnosis not present

## 2023-09-16 DIAGNOSIS — E782 Mixed hyperlipidemia: Secondary | ICD-10-CM

## 2023-09-16 MED ORDER — METOPROLOL SUCCINATE ER 25 MG PO TB24: 12.5000 mg | ORAL_TABLET | Freq: Every day | ORAL | 1 refills | Status: AC

## 2023-09-16 NOTE — Progress Notes (Signed)
 Cardiology Consultation:    Date:  09/16/2023   ID:  Lindsay Ferguson, DOB 1945/07/18, MRN [id number]  PCP:  Lindsay Roselie Rockford, NP  Cardiologist:  Lindsay JONELLE Kobus, MD   Referring MD: Lindsay Roselie Rockford, NP   No chief complaint on file.    ASSESSMENT AND PLAN:   Lindsay Ferguson 78 year old woman history of hypothyroidism, hyperlipidemia, left bundle branch block noted on EKG since 2023 at least and prior reported history of LV dysfunction EF 45% in 2015 with recent LVEF low normal 50 to 55% on echocardiogram June 02, 2023  Seen for further evaluation of chest pressure and discomfort and noted to have sinus rhythm with left bundle branch block morphology and frequent PVCs on EKG and uncontrolled blood pressures. With nonobstructive cardiac CT coronary imaging 08/19/2023 and heart monitor with occasional PVC burden up to 4.2% and a short run of NSVT up to 8 beats.   Problem List Items Addressed This Visit     Hyperlipidemia   Well controlled on last lipid panel January 2025. Continue simvastatin  20 mg once daily.       Relevant Medications   metoprolol  succinate (TOPROL  XL) 25 MG 24 hr tablet   Elevated blood pressure reading in office without diagnosis of hypertension   Follow-up blood pressures today in the office well-controlled. At this time no further treatment needed for hypertension.       Frequent PVCs - Primary   4.2% burden on heart monitor May 2025. Appears to be symptomatic Continue low-dose metoprolol , she is unable to tolerate metoprolol  tartrate twice daily. Will switch to metoprolol  succinate 12.5 mg once daily. If she is unable to tolerate even this dose, okay to discontinue, and continue to monitor her occasional PVC burden as there is no significant structural cardiac function or obstructive coronary artery disease and we will follow-up with watchful monitoring. .      Relevant Medications   metoprolol  succinate (TOPROL  XL) 25 MG 24 hr tablet    LV dysfunction   Prior echocardiogram from 2015 reported EF 45%. Recent echocardiogram 06/02/2023 EF 50 to 55%, grade 1 diastolic dysfunction. No significant obstructive coronary artery disease on cardiac CT June 2025. No significant abnormalities on heart monitor May 2025  Reassured her. No heart failure signs or symptoms. Would recommend continuing low-dose beta-blockers as reviewed above.      Relevant Medications   metoprolol  succinate (TOPROL  XL) 25 MG 24 hr tablet   Return to clinic tentatively in 1 year or as needed.   History of Present Illness:    Lindsay Ferguson is a 78 y.o. female who is being seen today for follow-up visit. PCP is Lindsay Ferguson, Roselie Rockford, NP. Last seen in office 07/17/2023. Here for the visit today by herself.  history of hypothyroidism, hyperlipidemia, left bundle branch block noted on EKG since 2023 at least and prior reported history of LV dysfunction EF 45% in 2015 with recent LVEF low normal 50 to 55% on echocardiogram June 02, 2023  Seen for further evaluation of chest pressure and discomfort and noted to have sinus rhythm with left bundle branch block morphology and frequent PVCs on EKG and uncontrolled blood pressures.  Cardiac CT coronary angiogram done 08/19/2023 noted calcium score 79 CAD RADS 2 study mild nonobstructive disease with total plaque volume 124 mm cube.  Heart monitor for 14 days noted occasional ventricular ectopy burden up to 4.2% with 1 short run of NSVT lasting 8 beats and 3 short runs of SVT the  longest episode lasting 11 beats all of which were asymptomatic.  No high-grade conduction blocks pauses or sustained arrhythmias.  Triggered events correlated mostly with normal heart rate and rhythm and isolated ventricular ectopy.  Heart rate was average 77/min [ranging from 53 to 148 bpm].  Echocardiogram previously from March 2025 noted LVEF 50 to 55% with grade 1 diastolic dysfunction.  Reports to be doing well.  Mentions  metoprolol  tartrate even the smaller dose can make her little bit tired. Continues to have occasional episodes of chest discomfort which are random sometimes even while she is falling asleep.  Appear to correlate with episodes of PVCs per description.  Mentions blood pressures have improved.  Past Medical History:  Diagnosis Date   Atypical chest pain 03/20/2022   Caregiver stress 03/20/2022   Chronic idiopathic constipation 12/27/2019   Chronic midline low back pain without sciatica 12/27/2019   Chronic pain of multiple joints 12/27/2019   Heart chamber dysfunction 09/09/2013   Herpesviral infection, unspecified 11/29/2016   HSV infection 11/29/2016   Hyperlipidemia    Hypothyroidism    Hypothyroidism, unspecified 11/29/2016   Osteoporosis 02/21/2011   Palpitations 09/09/2013   Polyp of colon 03/17/2023   Primary osteoarthritis of both hips 03/21/2021   Primary osteoarthritis of right knee 10/12/2013   Status post arthroscopic surgery of right knee 12/02/2013   Status post total replacement of left hip 06/25/2021   Tear of lateral cartilage or meniscus of knee, current 10/12/2013   Unilateral primary osteoarthritis, left hip 04/04/2021    Past Surgical History:  Procedure Laterality Date   ABDOMINAL HYSTERECTOMY     FACIAL FRACTURE SURGERY     SPINE SURGERY  1988   TOTAL HIP ARTHROPLASTY Left 06/08/2021   Procedure: LEFT TOTAL HIP ARTHROPLASTY ANTERIOR APPROACH;  Surgeon: Vernetta Lonni GRADE, MD;  Location: [facility];  Service: Orthopedics;  Laterality: Left;  Needs RNFA    Current Medications: Current Meds  Medication Sig   acyclovir  (ZOVIRAX ) 400 MG tablet TAKE 1 TABLET(400 MG) BY MOUTH FOUR TIMES DAILY AS NEEDED   alendronate  (FOSAMAX ) 10 MG tablet TAKE 1 TABLET(10 MG) BY MOUTH DAILY BEFORE BREAKFAST WITH A FULL GLASS OF WATER  AND ON AN EMPTY STOMACH   aspirin  EC 81 MG tablet Take 1 tablet (81 mg total) by mouth 2 (two) times daily. Swallow whole.   calcium carbonate  (OSCAL) 1500 (600 Ca) MG TABS tablet Take 1 tablet by mouth 2 (two) times daily with a meal.   cholecalciferol (VITAMIN D3) 25 MCG (1000 UNIT) tablet Take 1,000 Units by mouth daily.   gabapentin  (NEURONTIN ) 300 MG capsule TAKE ONE CAPSULE BY MOUTH IN THE MORNING AND 2 CAPSULES IN THE EVENING   levothyroxine  (SYNTHROID ) 50 MCG tablet Take 1 tablet (50 mcg total) by mouth daily before breakfast.   meloxicam  (MOBIC ) 7.5 MG tablet TAKE 1 TABLET(7.5 MG) BY MOUTH DAILY WITH FOOD   metoprolol  succinate (TOPROL  XL) 25 MG 24 hr tablet Take 0.5 tablets (12.5 mg total) by mouth daily.   simvastatin  (ZOCOR ) 20 MG tablet TAKE 1 TABLET(20 MG) BY MOUTH AT BEDTIME   [DISCONTINUED] metoprolol  tartrate (LOPRESSOR ) 25 MG tablet Take 0.5 tablets (12.5 mg total) by mouth 2 (two) times daily.     Allergies:   Oxycodone , Prednisone, and Codeine   Social History   Socioeconomic History   Marital status: Married    Spouse name: Not on file   Number of children: Not on file   Years of education: Not on file  Highest education level: Some college, no degree  Occupational History   Not on file  Tobacco Use   Smoking status: Never   Smokeless tobacco: Never  Vaping Use   Vaping status: Never Used  Substance and Sexual Activity   Alcohol use: Not Currently    Comment: Occasionally (wine)   Drug use: Never   Sexual activity: Not Currently    Birth control/protection: Post-menopausal  Other Topics Concern   Not on file  Social History Narrative   Not on file   Social Drivers of Health   Financial Resource Strain: Low Risk  (06/30/2023)   Overall Financial Resource Strain (CARDIA)    Difficulty of Paying Living Expenses: Not hard at all  Food Insecurity: No Food Insecurity (06/30/2023)   Hunger Vital Sign    Worried About Running Out of Food in the Last Year: Never true    Ran Out of Food in the Last Year: Never true  Transportation Needs: No Transportation Needs (06/30/2023)   PRAPARE - Therapist, art (Medical): No    Lack of Transportation (Non-Medical): No  Physical Activity: Inactive (06/30/2023)   Exercise Vital Sign    Days of Exercise per Week: 0 days    Minutes of Exercise per Session: 0 min  Stress: No Stress Concern Present (06/30/2023)   [employer] of Occupational Health - Occupational Stress Questionnaire    Feeling of Stress : Not at all  Social Connections: Moderately Isolated (06/30/2023)   Social Connection and Isolation Panel    Frequency of Communication with Friends and Family: More than three times a week    Frequency of Social Gatherings with Friends and Family: Twice a week    Attends Religious Services: Never    Database administrator or Organizations: No    Attends Engineer, structural: Never    Marital Status: Married     Family History: The patient's family history includes AAA (abdominal aortic aneurysm) in her sister; Breast cancer in her sister; Cancer in her mother; Diabetes in her father and sister; Stroke in her sister. ROS:   Please see the history of present illness.    All 14 point review of systems negative except as described per history of present illness.  EKGs/Labs/Other Studies Reviewed:    The following studies were reviewed today:   EKG:       Recent Labs: 03/17/2023: ALT 15; BUN 8; Creatinine, Ser 0.66; Hemoglobin 13.1; Platelets 187.0; Potassium 4.3; Sodium 139; TSH 1.65  Recent Lipid Panel    Component Value Date/Time   CHOL 182 03/17/2023 0914   TRIG 70.0 03/17/2023 0914   HDL 93.50 03/17/2023 0914   CHOLHDL 2 03/17/2023 0914   VLDL 14.0 03/17/2023 0914   LDLCALC 74 03/17/2023 0914    Physical Exam:    VS:  BP 120/74   Pulse 68   Ht 4' 11 (1.499 m)   Wt 112 lb (50.8 kg)   SpO2 98%   BMI 22.62 kg/m     Wt Readings from Last 3 Encounters:  09/16/23 112 lb (50.8 kg)  07/17/23 113 lb (51.3 kg)  05/09/23 113 lb 6.4 oz (51.4 kg)     GENERAL:  Well nourished, well developed  in no acute distress NECK: No JVD; No carotid bruits CARDIAC: RRR, S1 and S2 present, no murmurs, no rubs, no gallops CHEST:  Clear to auscultation without rales, wheezing or rhonchi  Extremities: No pitting pedal edema. Pulses bilaterally symmetric with  radial 2+ and dorsalis pedis 2+ NEUROLOGIC:  Alert and oriented x 3  Medication Adjustments/Labs and Tests Ordered: Current medicines are reviewed at length with the patient today.  Concerns regarding medicines are outlined above.  No orders of the defined types were placed in this encounter.  Meds ordered this encounter  Medications   metoprolol  succinate (TOPROL  XL) 25 MG 24 hr tablet    Sig: Take 0.5 tablets (12.5 mg total) by mouth daily.    Dispense:  45 tablet    Refill:  1    Signed, Liborio Saccente reddy Anina Schnake, MD, MPH, [facility]. 09/16/2023 9:13 AM    [facility]

## 2023-09-16 NOTE — Assessment & Plan Note (Signed)
 Follow-up blood pressures today in the office well-controlled. At this time no further treatment needed for hypertension.

## 2023-09-16 NOTE — Assessment & Plan Note (Signed)
 Prior echocardiogram from 2015 reported EF 45%. Recent echocardiogram 06/02/2023 EF 50 to 55%, grade 1 diastolic dysfunction. No significant obstructive coronary artery disease on cardiac CT June 2025. No significant abnormalities on heart monitor May 2025  Reassured her. No heart failure signs or symptoms. Would recommend continuing low-dose beta-blockers as reviewed above.

## 2023-09-16 NOTE — Patient Instructions (Signed)
 Medication Instructions:  Your physician has recommended you make the following change in your medication:   ** Stop Aspirin   ** Stop Metoprolol  Tartrate  ** Begin Metoprolol  Succinate 25mg  - 1/2 tablet (12.5mg ) by mouth daily  *If you need a refill on your cardiac medications before your next appointment, please call your pharmacy*  Lab Work: None ordered.  If you have labs (blood work) drawn today and your tests are completely normal, you will receive your results only by: MyChart Message (if you have MyChart) OR A paper copy in the mail If you have any lab test that is abnormal or we need to change your treatment, we will call you to review the results.  Testing/Procedures: None ordered.   Follow-Up: At [facility], you and your health needs are our priority.  As part of our continuing mission to provide you with exceptional heart care, our providers are all part of one team.  This team includes your primary Cardiologist (physician) and Advanced Practice Providers or APPs (Physician Assistants and Nurse Practitioners) who all work together to provide you with the care you need, when you need it.  Your next appointment:   12 months with Dr Madireddy

## 2023-09-16 NOTE — Assessment & Plan Note (Signed)
 4.2% burden on heart monitor May 2025. Appears to be symptomatic Continue low-dose metoprolol , she is unable to tolerate metoprolol  tartrate twice daily. Will switch to metoprolol  succinate 12.5 mg once daily. If she is unable to tolerate even this dose, okay to discontinue, and continue to monitor her occasional PVC burden as there is no significant structural cardiac function or obstructive coronary artery disease and we will follow-up with watchful monitoring. SABRA

## 2023-09-16 NOTE — Assessment & Plan Note (Signed)
 Well controlled on last lipid panel January 2025. Continue simvastatin  20 mg once daily.

## 2023-09-22 ENCOUNTER — Other Ambulatory Visit: Payer: Self-pay | Admitting: Nurse Practitioner

## 2023-09-22 ENCOUNTER — Ambulatory Visit: Payer: Self-pay | Admitting: Nurse Practitioner

## 2023-09-22 ENCOUNTER — Ambulatory Visit (INDEPENDENT_AMBULATORY_CARE_PROVIDER_SITE_OTHER): Payer: Medicare PPO | Admitting: Nurse Practitioner

## 2023-09-22 ENCOUNTER — Encounter: Payer: Self-pay | Admitting: Nurse Practitioner

## 2023-09-22 VITALS — BP 122/76 | HR 64 | Temp 97.8°F | Ht 59.0 in | Wt 115.2 lb

## 2023-09-22 DIAGNOSIS — E039 Hypothyroidism, unspecified: Secondary | ICD-10-CM

## 2023-09-22 DIAGNOSIS — G8929 Other chronic pain: Secondary | ICD-10-CM

## 2023-09-22 DIAGNOSIS — F4329 Adjustment disorder with other symptoms: Secondary | ICD-10-CM | POA: Diagnosis not present

## 2023-09-22 DIAGNOSIS — K635 Polyp of colon: Secondary | ICD-10-CM

## 2023-09-22 DIAGNOSIS — M2550 Pain in unspecified joint: Secondary | ICD-10-CM | POA: Diagnosis not present

## 2023-09-22 DIAGNOSIS — M810 Age-related osteoporosis without current pathological fracture: Secondary | ICD-10-CM

## 2023-09-22 LAB — BASIC METABOLIC PANEL WITH GFR
BUN: 7 mg/dL (ref 6–23)
CO2: 27 meq/L (ref 19–32)
Calcium: 9.3 mg/dL (ref 8.4–10.5)
Chloride: 104 meq/L (ref 96–112)
Creatinine, Ser: 0.61 mg/dL (ref 0.40–1.20)
GFR: 86.67 mL/min (ref >60.00)
Glucose, Bld: 88 mg/dL (ref 70–99)
Potassium: 3.9 meq/L (ref 3.5–5.1)
Sodium: 140 meq/L (ref 135–145)

## 2023-09-22 LAB — TSH: TSH: 0.99 u[IU]/mL (ref 0.35–5.50)

## 2023-09-22 LAB — T4, FREE: Free T4: 0.89 ng/dL (ref 0.60–1.60)

## 2023-09-22 MED ORDER — LEVOTHYROXINE SODIUM 50 MCG PO TABS: 50.0000 ug | ORAL_TABLET | Freq: Every day | ORAL | 1 refills | Status: DC

## 2023-09-22 NOTE — Assessment & Plan Note (Signed)
 Death of disabled sister 1year ago, struggles with depressive symptoms Denies any SI/HI/hallucination. Denied referral to a therapist    06/30/2023    8:17 AM 09/18/2022    8:20 AM 06/28/2022    8:17 AM  Depression screen PHQ 2/9  Decreased Interest 0 1 0  Down, Depressed, Hopeless 1 2 0  PHQ - 2 Score 1 3 0  Altered sleeping 1 2   Tired, decreased energy 1    Change in appetite 0 0   Feeling bad or failure about yourself  0 0   Trouble concentrating 0 2   Moving slowly or fidgety/restless 0 1   Suicidal thoughts 0 0   PHQ-9 Score 3 8   Difficult doing work/chores Not difficult at all Not difficult at all        09/18/2022    8:21 AM 03/20/2022    9:49 AM 09/25/2021    8:48 AM 03/21/2021    8:22 AM  GAD 7 : Generalized Anxiety Score  Nervous, Anxious, on Edge 2 2 1 1   Control/stop worrying 3 2 1 1   Worry too much - different things 2 2 1 1   Trouble relaxing 3 3 2 2   Restless 3 3 3 2   Easily annoyed or irritable 1 2 2 1   Afraid - awful might happen 1 0 0 0  Total GAD 7 Score 15 14 10 8   Anxiety Difficulty Somewhat difficult Somewhat difficult Somewhat difficult Not difficult at all

## 2023-09-22 NOTE — Assessment & Plan Note (Signed)
 Improved with meloxicam  and gabapentin  BID Repeat BMP Maintain current medication

## 2023-09-22 NOTE — Progress Notes (Signed)
 Established Patient Visit  Patient: Lindsay Ferguson   DOB: 02-11-1946   78 y.o. Female  MRN: [id number] Visit Date: 09/22/2023  Subjective:    Chief Complaint  Patient presents with   Follow-up    (FASTING) 6 month follow up Hyperlipidemia, Hypothyroidism    HPI Hypothyroidism, unspecified Repeat Tsh and T4 Maintain med dose while waiting for lab results Wt Readings from Last 3 Encounters:  09/22/23 115 lb 3.2 oz (52.3 kg)  09/16/23 112 lb (50.8 kg)  07/17/23 113 lb (51.3 kg)     Chronic pain of multiple joints Improved with meloxicam  and gabapentin  BID Repeat BMP Maintain current medication  Prolonged grief reaction Death of disabled sister 1year ago, struggles with depressive symptoms Denies any SI/HI/hallucination. Denied referral to a therapist    06/30/2023    8:17 AM 09/18/2022    8:20 AM 06/28/2022    8:17 AM  Depression screen PHQ 2/9  Decreased Interest 0 1 0  Down, Depressed, Hopeless 1 2 0  PHQ - 2 Score 1 3 0  Altered sleeping 1 2   Tired, decreased energy 1    Change in appetite 0 0   Feeling bad or failure about yourself  0 0   Trouble concentrating 0 2   Moving slowly or fidgety/restless 0 1   Suicidal thoughts 0 0   PHQ-9 Score 3 8   Difficult doing work/chores Not difficult at all Not difficult at all        09/18/2022    8:21 AM 03/20/2022    9:49 AM 09/25/2021    8:48 AM 03/21/2021    8:22 AM  GAD 7 : Generalized Anxiety Score  Nervous, Anxious, on Edge 2 2 1 1   Control/stop worrying 3 2 1 1   Worry too much - different things 2 2 1 1   Trouble relaxing 3 3 2 2   Restless 3 3 3 2   Easily annoyed or irritable 1 2 2 1   Afraid - awful might happen 1 0 0 0  Total GAD 7 Score 15 14 10 8   Anxiety Difficulty Somewhat difficult Somewhat difficult Somewhat difficult Not difficult at all    Polyp of colon Advised to schedule appointment with [facility], but she declined  BP Readings from Last 3 Encounters:  09/22/23 122/76   09/16/23 120/74  08/19/23 (!) 150/84    Reviewed medical, surgical, and social history today  Medications: Outpatient Medications Prior to Visit  Medication Sig   acyclovir  (ZOVIRAX ) 400 MG tablet TAKE 1 TABLET(400 MG) BY MOUTH FOUR TIMES DAILY AS NEEDED   alendronate  (FOSAMAX ) 10 MG tablet TAKE 1 TABLET(10 MG) BY MOUTH DAILY BEFORE BREAKFAST WITH A FULL GLASS OF WATER  AND ON AN EMPTY STOMACH   aspirin  EC 81 MG tablet Take 1 tablet (81 mg total) by mouth 2 (two) times daily. Swallow whole.   calcium carbonate (OSCAL) 1500 (600 Ca) MG TABS tablet Take 1 tablet by mouth 2 (two) times daily with a meal.   cholecalciferol (VITAMIN D3) 25 MCG (1000 UNIT) tablet Take 1,000 Units by mouth daily.   gabapentin  (NEURONTIN ) 300 MG capsule TAKE ONE CAPSULE BY MOUTH IN THE MORNING AND 2 CAPSULES IN THE EVENING   levothyroxine  (SYNTHROID ) 50 MCG tablet Take 1 tablet (50 mcg total) by mouth daily before breakfast.   meloxicam  (MOBIC ) 7.5 MG tablet TAKE 1 TABLET(7.5 MG) BY MOUTH DAILY WITH FOOD   metoprolol  succinate (  TOPROL  XL) 25 MG 24 hr tablet Take 0.5 tablets (12.5 mg total) by mouth daily.   simvastatin  (ZOCOR ) 20 MG tablet TAKE 1 TABLET(20 MG) BY MOUTH AT BEDTIME   No facility-administered medications prior to visit.   Reviewed past medical and social history.   ROS per HPI above      Objective:  BP 122/76 (BP Location: Left Arm, Patient Position: Sitting, Cuff Size: Normal)   Pulse 64   Temp 97.8 F (36.6 C) (Oral)   Ht 4' 11 (1.499 m)   Wt 115 lb 3.2 oz (52.3 kg)   SpO2 98%   BMI 23.27 kg/m      Physical Exam Vitals and nursing note reviewed.  Neck:     Thyroid : No thyroid  mass, thyromegaly or thyroid  tenderness.  Cardiovascular:     Rate and Rhythm: Normal rate and regular rhythm.     Pulses: Normal pulses.     Heart sounds: Normal heart sounds.  Pulmonary:     Effort: Pulmonary effort is normal.     Breath sounds: Normal breath sounds.  Neurological:     Mental  Status: She is alert and oriented to person, place, and time.  Psychiatric:        Attention and Perception: Attention normal.        Mood and Affect: Mood is depressed.        Speech: Speech normal.        Behavior: Behavior is cooperative.        Thought Content: Thought content normal.        Cognition and Memory: Cognition and memory normal.        Judgment: Judgment normal.     No results found for any visits on 09/22/23.    Assessment & Plan:    Problem List Items Addressed This Visit     Chronic pain of multiple joints   Improved with meloxicam  and gabapentin  BID Repeat BMP Maintain current medication      Relevant Orders   Basic metabolic panel with GFR   Hypothyroidism, unspecified - Primary   Repeat Tsh and T4 Maintain med dose while waiting for lab results Wt Readings from Last 3 Encounters:  09/22/23 115 lb 3.2 oz (52.3 kg)  09/16/23 112 lb (50.8 kg)  07/17/23 113 lb (51.3 kg)         Relevant Orders   TSH   T4, free   Polyp of colon   Advised to schedule appointment with [facility], but she declined      Prolonged grief reaction   Death of disabled sister 1year ago, struggles with depressive symptoms Denies any SI/HI/hallucination. Denied referral to a therapist    06/30/2023    8:17 AM 09/18/2022    8:20 AM 06/28/2022    8:17 AM  Depression screen PHQ 2/9  Decreased Interest 0 1 0  Down, Depressed, Hopeless 1 2 0  PHQ - 2 Score 1 3 0  Altered sleeping 1 2   Tired, decreased energy 1    Change in appetite 0 0   Feeling bad or failure about yourself  0 0   Trouble concentrating 0 2   Moving slowly or fidgety/restless 0 1   Suicidal thoughts 0 0   PHQ-9 Score 3 8   Difficult doing work/chores Not difficult at all Not difficult at all        09/18/2022    8:21 AM 03/20/2022    9:49 AM 09/25/2021    8:48 AM  03/21/2021    8:22 AM  GAD 7 : Generalized Anxiety Score  Nervous, Anxious, on Edge 2 2 1 1   Control/stop worrying 3 2 1 1   Worry too  much - different things 2 2 1 1   Trouble relaxing 3 3 2 2   Restless 3 3 3 2   Easily annoyed or irritable 1 2 2 1   Afraid - awful might happen 1 0 0 0  Total GAD 7 Score 15 14 10 8   Anxiety Difficulty Somewhat difficult Somewhat difficult Somewhat difficult Not difficult at all        Return in about 6 months (around 03/24/2024) for Hypothyroidism, hyperlipidemia (fasting).     Roselie Mood, NP

## 2023-09-22 NOTE — Patient Instructions (Signed)
 Go to lab Maintain Heart healthy diet and daily exercise. Maintain current medications. Schedule appointment with [facility]

## 2023-09-22 NOTE — Assessment & Plan Note (Signed)
 Repeat Tsh and T4 Maintain med dose while waiting for lab results Wt Readings from Last 3 Encounters:  09/22/23 115 lb 3.2 oz (52.3 kg)  09/16/23 112 lb (50.8 kg)  07/17/23 113 lb (51.3 kg)

## 2023-09-22 NOTE — Assessment & Plan Note (Signed)
 Advised to schedule appointment with [facility], but she declined

## 2023-10-30 IMAGING — DX DG HIP (WITH OR WITHOUT PELVIS) 1V*L*
2 series · 2 of 2 positions shown · non-contrast
Comparison: None.

CLINICAL DATA: Hip pain with limited range of motion.

EXAM:
DG HIP (WITH OR WITHOUT PELVIS) 1V*L*

[pelvis ap]
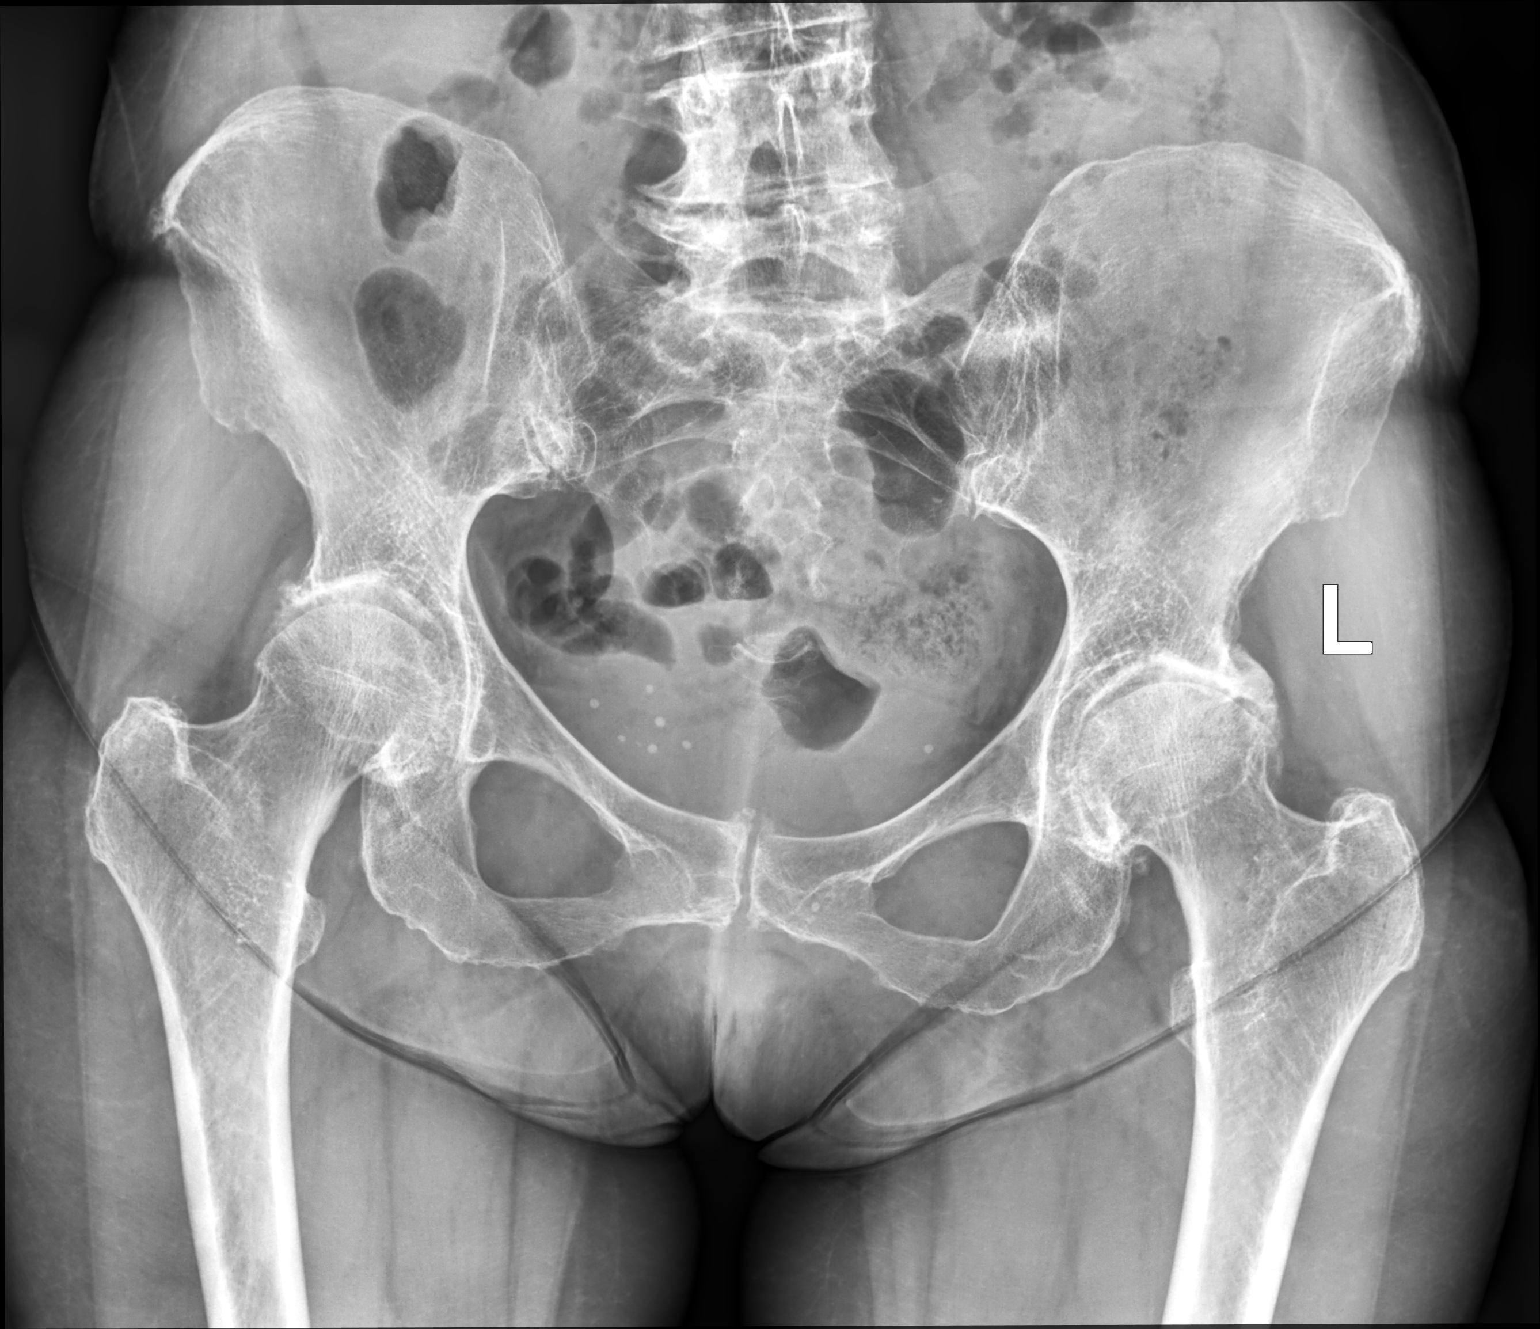

[hip frog leg lat]
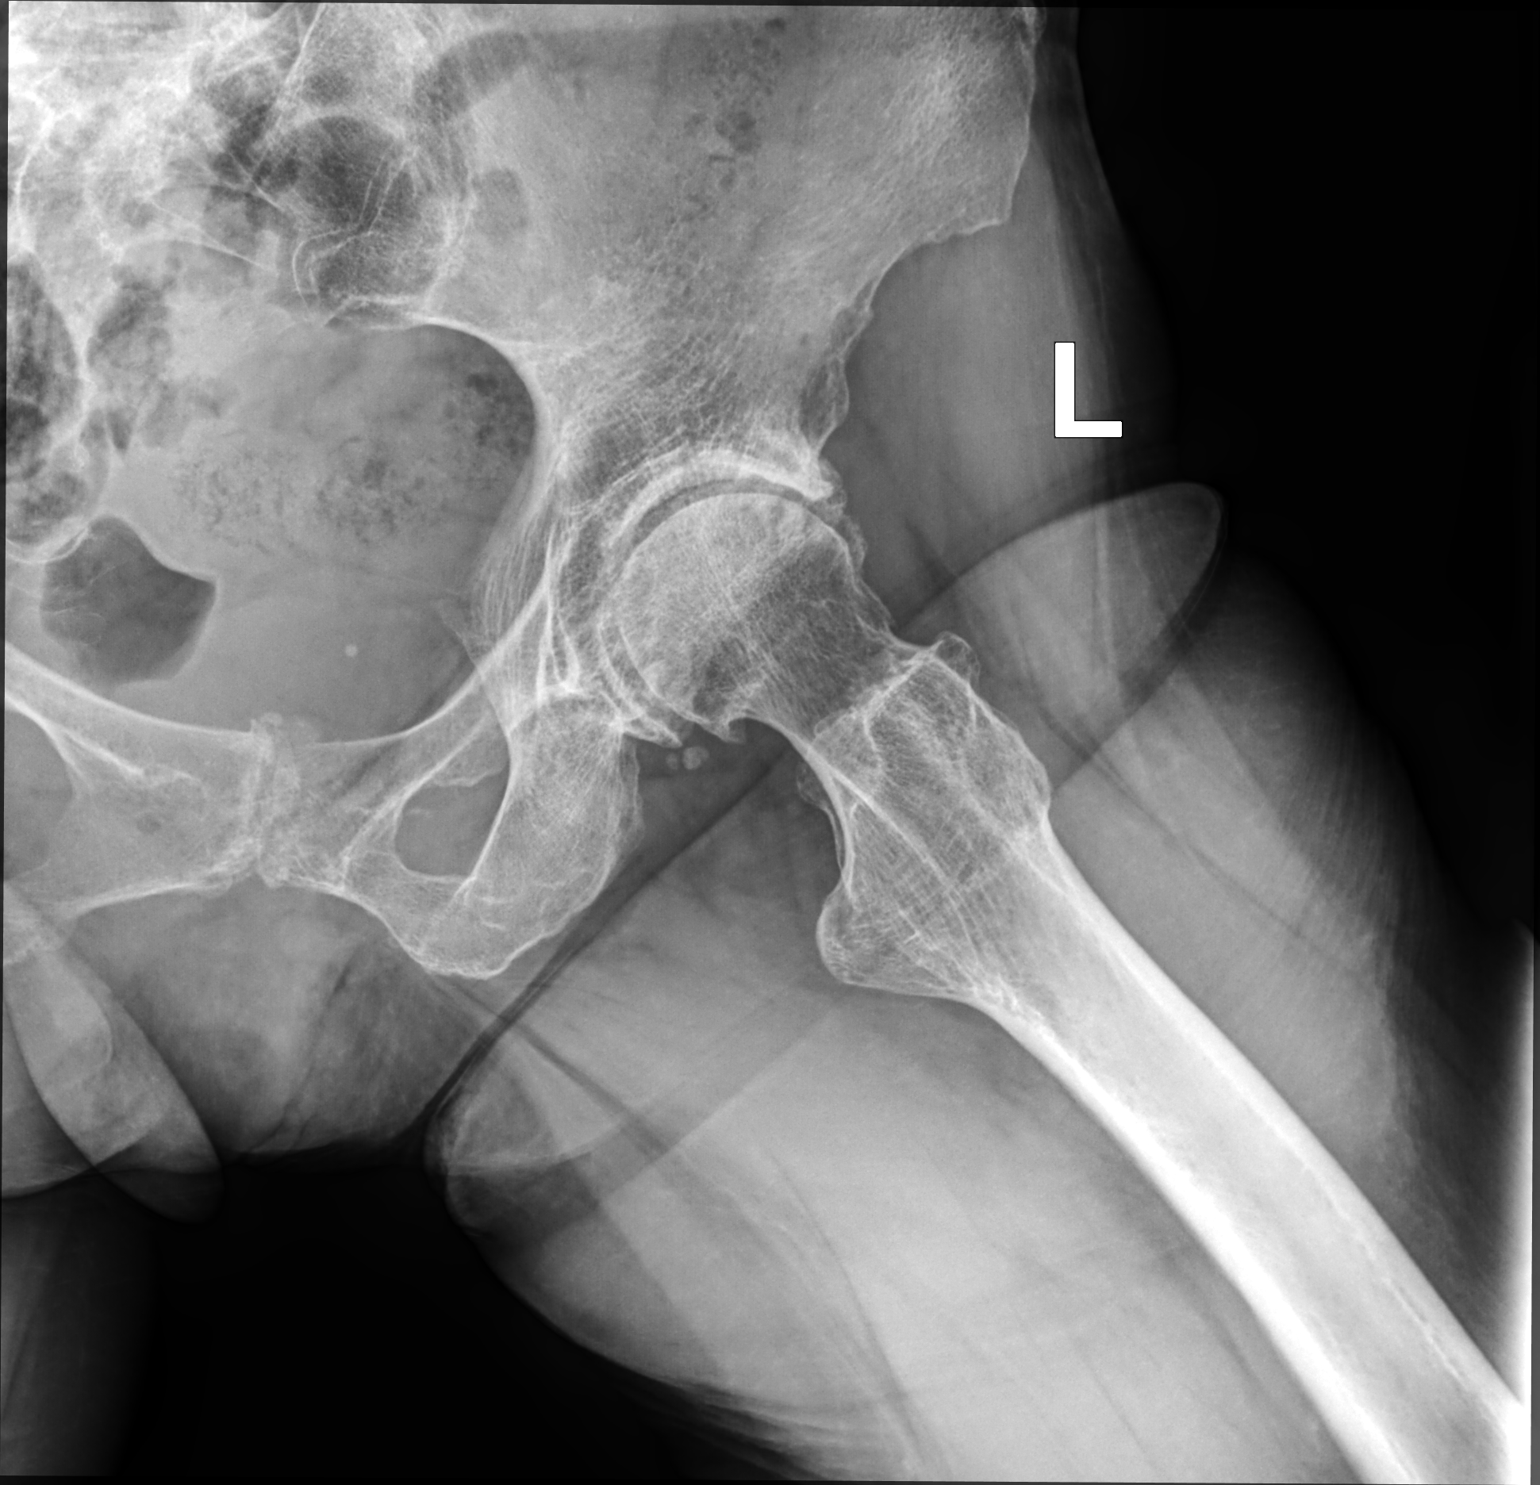

[2 of 2 positions shown; findings below may reference images not displayed]

FINDINGS: Severe superior left and moderate superior right femoroacetabular
joint space narrowing. Minimal superior and superomedial left
femoral head cortical flattening/remodeling. Moderate left and mild
right femoral head-neckr junction degenerative osteophytes. There
are 2 ossicles measuring up to 4 mm at the anterior inferior left
femoroacetabular joint, loose bodies. Moderate joint space narrowing
and peripheral osteophytosis of the pubic symphysis. Moderate
right-greater-than-left L4-5 disc space narrowing and moderate right
endplate osteophytes. Vascular phleboliths overlie the pelvis.
IMPRESSION: Severe left and moderate right femoroacetabular osteoarthritis.

## 2023-11-21 ENCOUNTER — Other Ambulatory Visit: Payer: Self-pay | Admitting: Nurse Practitioner

## 2023-11-21 DIAGNOSIS — G8929 Other chronic pain: Secondary | ICD-10-CM

## 2023-11-22 ENCOUNTER — Encounter: Payer: Self-pay | Admitting: Nurse Practitioner

## 2023-12-23 ENCOUNTER — Telehealth: Payer: Self-pay | Admitting: Nurse Practitioner

## 2023-12-23 DIAGNOSIS — M810 Age-related osteoporosis without current pathological fracture: Secondary | ICD-10-CM

## 2023-12-23 NOTE — Telephone Encounter (Signed)
 Please reorder Dexa scan at [facility]. [facility] is no longer scheduling Dexa/Bone Density scans. Please route message back to Admin team to contact patient for scheduling.

## 2024-01-17 IMAGING — DX DG PORTABLE PELVIS
1 series · 1 of 1 positions shown · non-contrast
Comparison: Preoperative intraoperative evaluations from [REDACTED] and [DATE].

CLINICAL DATA: Status post hip surgery a 75-year-old female.

EXAM:
PORTABLE PELVIS 1-2 VIEWS

[pelvis ap]
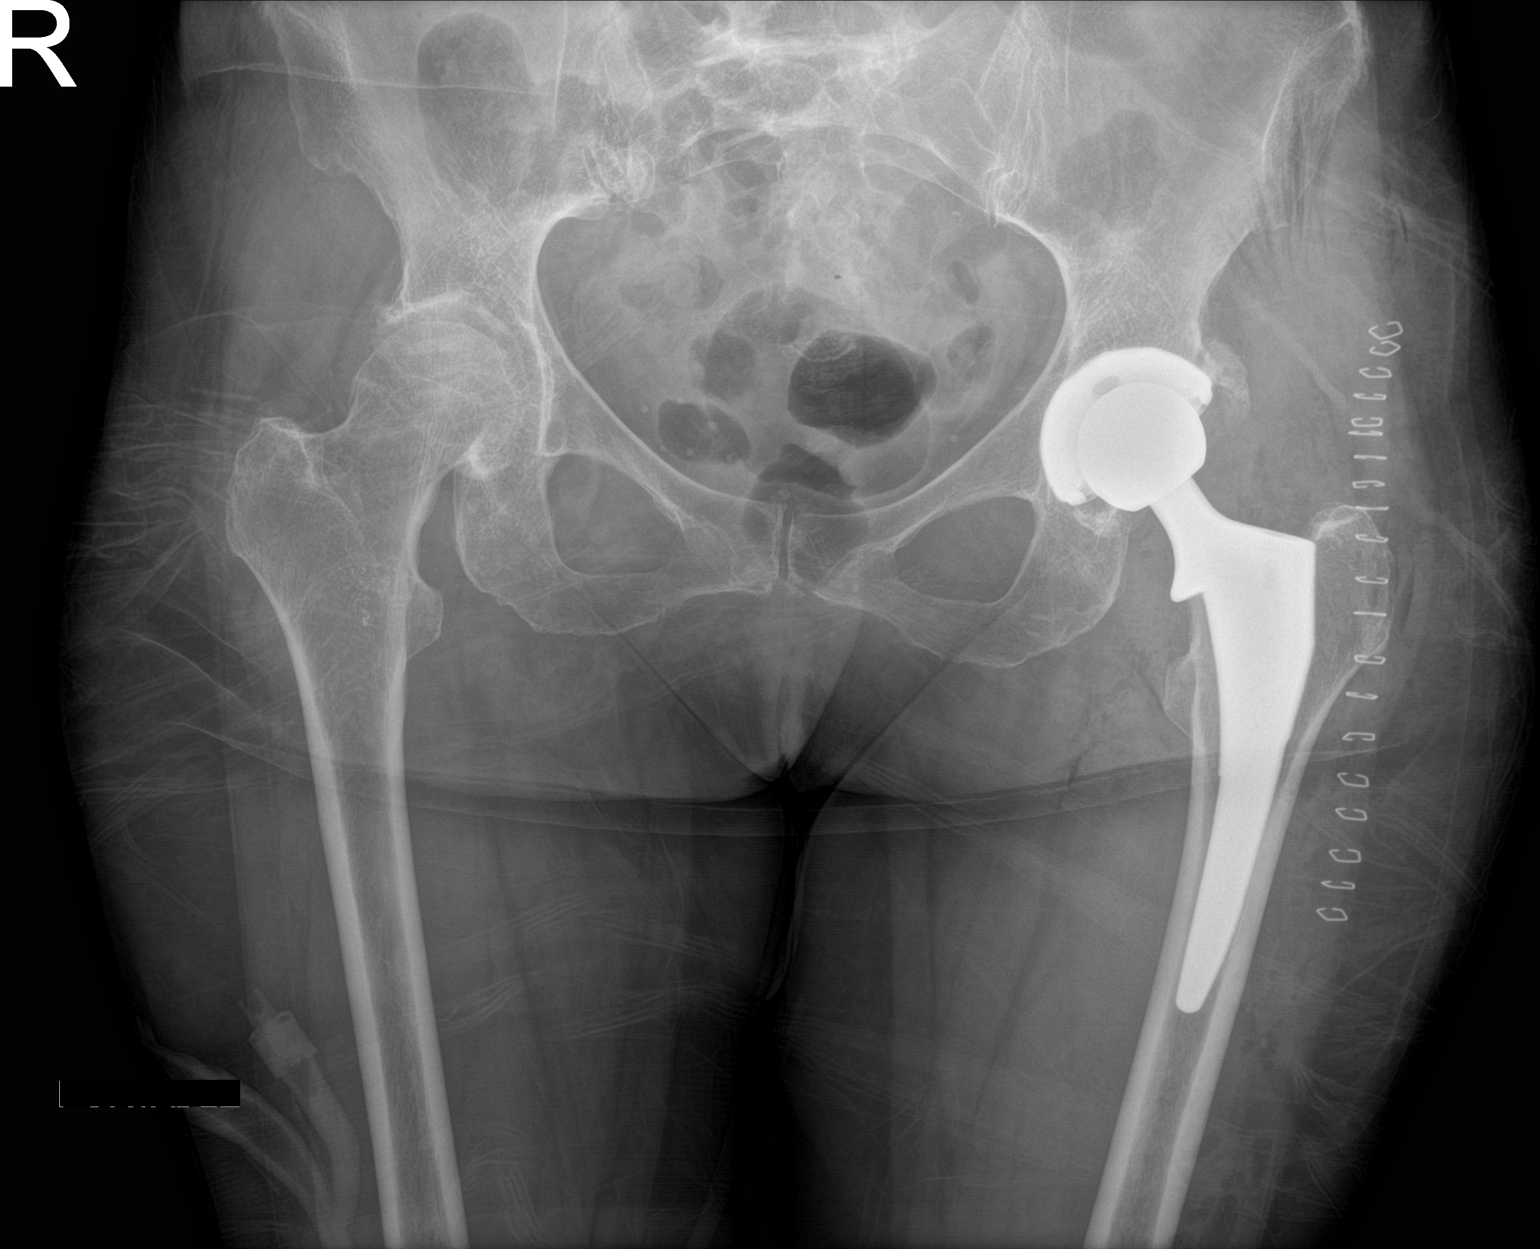

[1 of 1 positions shown; findings below may reference images not displayed]

FINDINGS: Low AP pelvis excluding the upper iliac crest shows changes of LEFT
total hip arthroplasty without signs of complication in the AP
projection. Gas in the soft tissues and skin staples over the
surgical site.

Moderate to marked degenerative change about the RIGHT hip.
IMPRESSION: Status post LEFT total hip arthroplasty no unexpected radiographic
findings on AP projection following recent surgery.

## 2024-01-17 IMAGING — XA DG HIP (WITH OR WITHOUT PELVIS) 1V*L*
1 series · 3 of 3 positions shown · non-contrast
Comparison: 03/21/2016

CLINICAL DATA: Left hip replacement

EXAM:
OPERATIVE LEFT HIP (WITH PELVIS IF PERFORMED) 1 VIEW
TECHNIQUE: Fluoroscopic spot image(s) were submitted for interpretation
post-operatively.

[Series 1: unknown protocol · 3 of 3 slices shown]
[im 1/3]
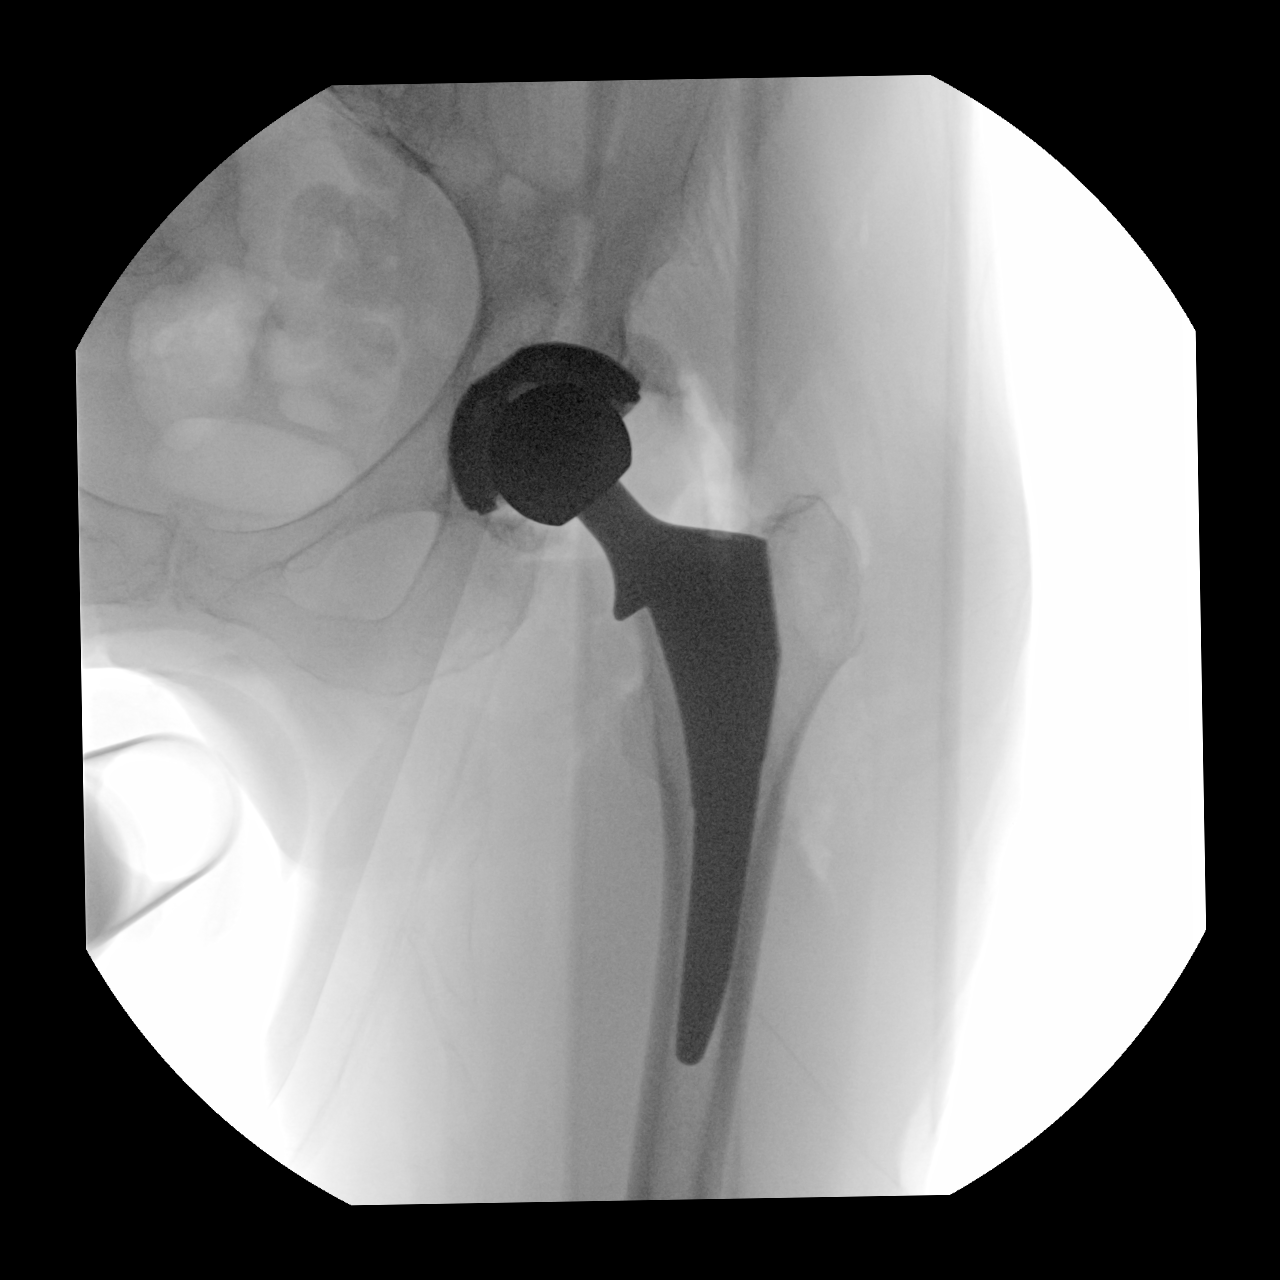
[im 2/3]
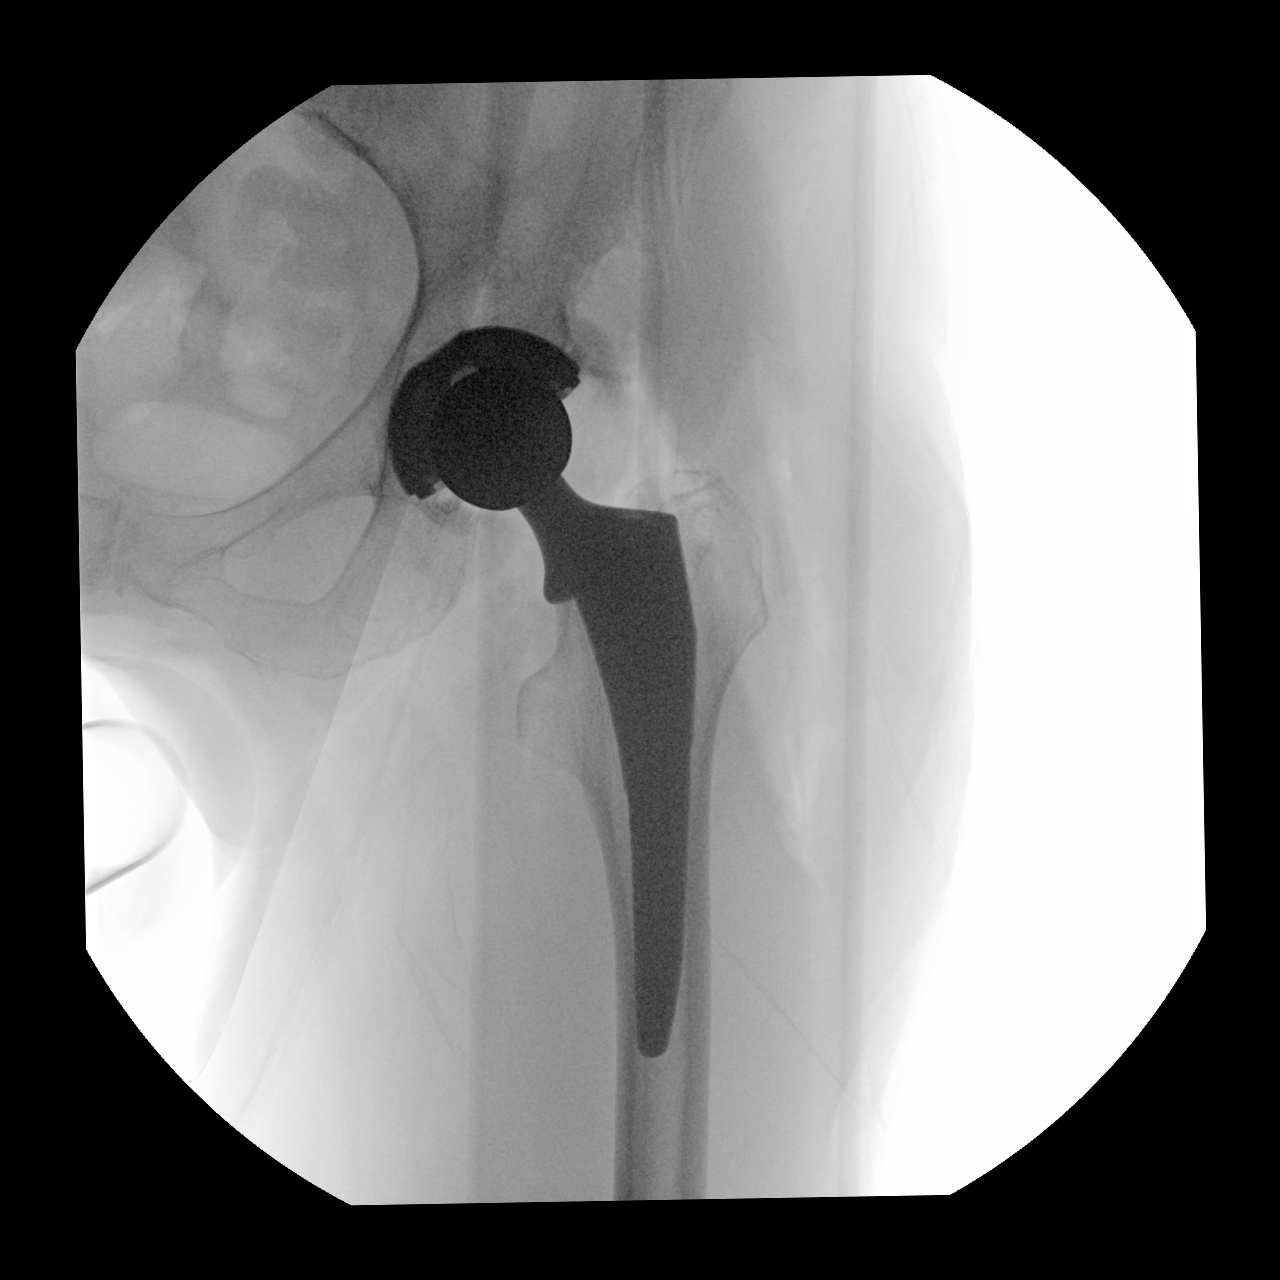
[im 3/3]
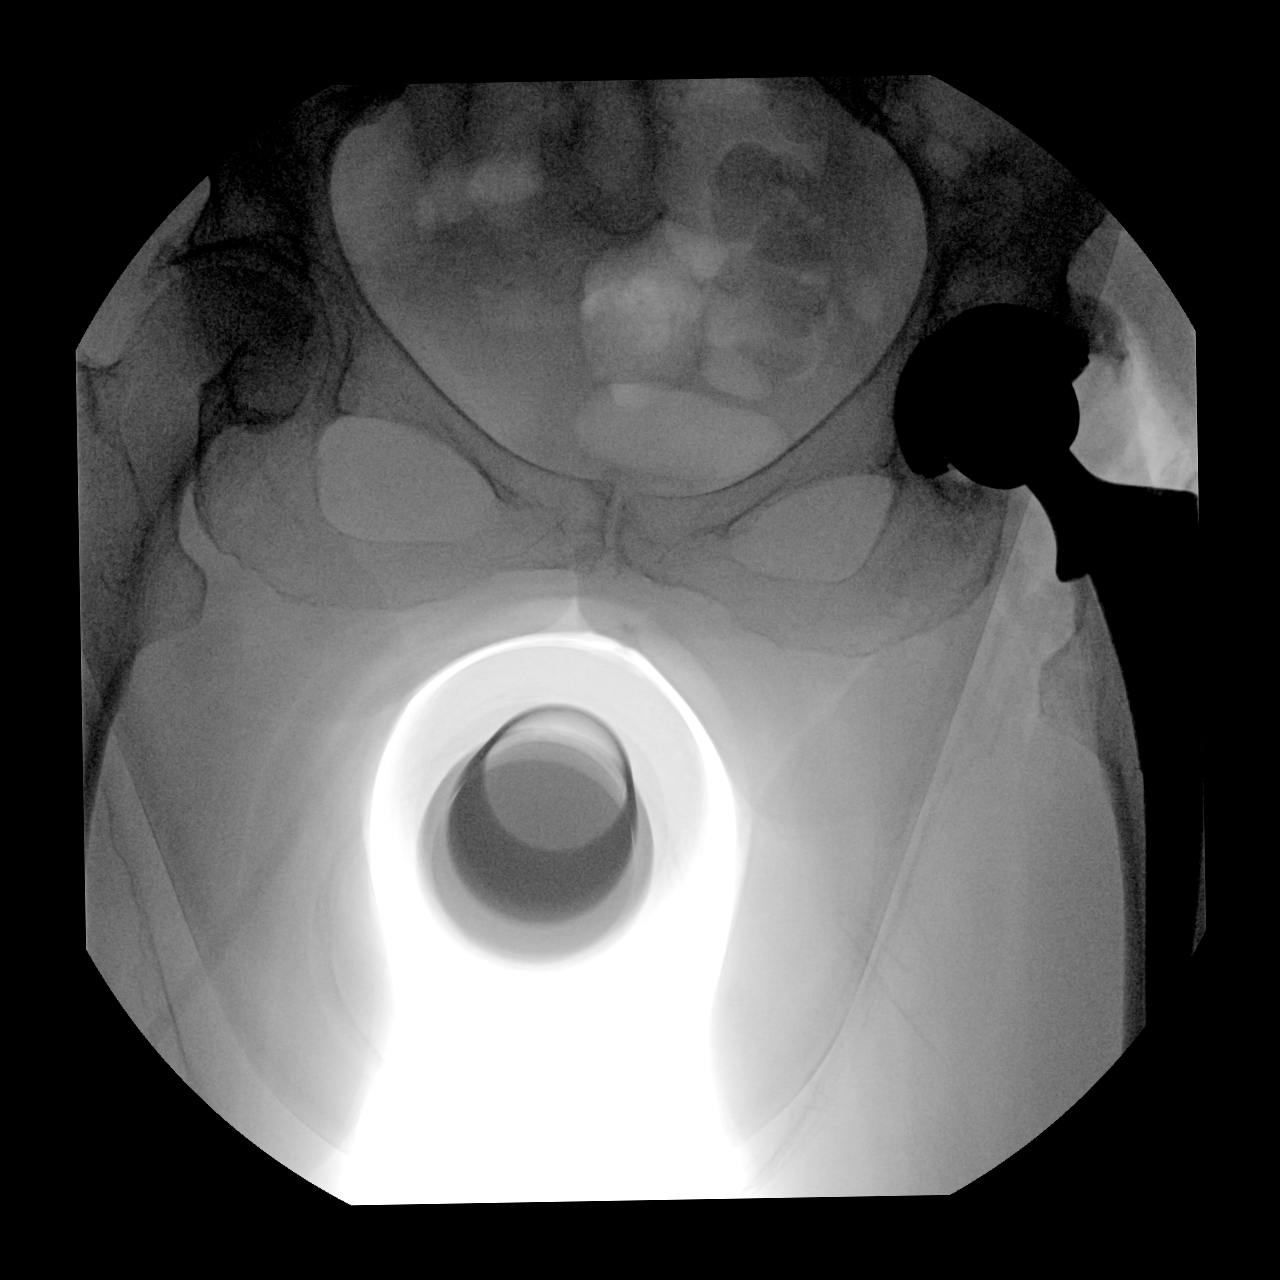

[3 of 3 positions shown; findings below may reference images not displayed]

FINDINGS: Three intraoperative frontal fluoroscopic images of the left hip are
provided for interpretation. The submitted images demonstrate left
total hip prosthesis. Subcutaneous emphysema consistent with
intraoperative status.
IMPRESSION: Intraoperative fluoroscopic images of left total hip replacement as
above.

## 2024-01-20 ENCOUNTER — Other Ambulatory Visit: Payer: Self-pay | Admitting: Nurse Practitioner

## 2024-01-20 DIAGNOSIS — G8929 Other chronic pain: Secondary | ICD-10-CM

## 2024-01-20 DIAGNOSIS — M545 Low back pain, unspecified: Secondary | ICD-10-CM

## 2024-03-24 ENCOUNTER — Ambulatory Visit: Admitting: Nurse Practitioner

## 2024-03-24 ENCOUNTER — Ambulatory Visit: Payer: Self-pay | Admitting: Nurse Practitioner

## 2024-03-24 ENCOUNTER — Other Ambulatory Visit: Payer: Self-pay | Admitting: Nurse Practitioner

## 2024-03-24 ENCOUNTER — Encounter: Payer: Self-pay | Admitting: Nurse Practitioner

## 2024-03-24 VITALS — BP 118/72 | HR 68 | Temp 97.6°F | Ht 59.0 in | Wt 116.0 lb

## 2024-03-24 DIAGNOSIS — E782 Mixed hyperlipidemia: Secondary | ICD-10-CM

## 2024-03-24 DIAGNOSIS — G8929 Other chronic pain: Secondary | ICD-10-CM | POA: Diagnosis not present

## 2024-03-24 DIAGNOSIS — M810 Age-related osteoporosis without current pathological fracture: Secondary | ICD-10-CM

## 2024-03-24 DIAGNOSIS — E039 Hypothyroidism, unspecified: Secondary | ICD-10-CM

## 2024-03-24 DIAGNOSIS — M545 Low back pain, unspecified: Secondary | ICD-10-CM | POA: Diagnosis not present

## 2024-03-24 DIAGNOSIS — M2550 Pain in unspecified joint: Secondary | ICD-10-CM | POA: Diagnosis not present

## 2024-03-24 DIAGNOSIS — E559 Vitamin D deficiency, unspecified: Secondary | ICD-10-CM

## 2024-03-24 LAB — COMPREHENSIVE METABOLIC PANEL WITH GFR
ALT: 12 U/L (ref 3–35)
AST: 22 U/L (ref 5–37)
Albumin: 4.5 g/dL (ref 3.5–5.2)
Alkaline Phosphatase: 47 U/L (ref 39–117)
BUN: 14 mg/dL (ref 6–23)
CO2: 27 meq/L (ref 19–32)
Calcium: 9.2 mg/dL (ref 8.4–10.5)
Chloride: 104 meq/L (ref 96–112)
Creatinine, Ser: 0.68 mg/dL (ref 0.40–1.20)
GFR: 84.13 mL/min
Glucose, Bld: 83 mg/dL (ref 70–99)
Potassium: 4.1 meq/L (ref 3.5–5.1)
Sodium: 139 meq/L (ref 135–145)
Total Bilirubin: 0.5 mg/dL (ref 0.2–1.2)
Total Protein: 6.9 g/dL (ref 6.0–8.3)

## 2024-03-24 LAB — T4, FREE: Free T4: 0.81 ng/dL (ref 0.60–1.60)

## 2024-03-24 LAB — LIPID PANEL
Cholesterol: 179 mg/dL (ref 28–200)
HDL: 86.6 mg/dL
LDL Cholesterol: 74 mg/dL (ref 10–99)
NonHDL: 91.98
Total CHOL/HDL Ratio: 2
Triglycerides: 90 mg/dL (ref 10.0–149.0)
VLDL: 18 mg/dL (ref 0.0–40.0)

## 2024-03-24 LAB — TSH: TSH: 0.75 u[IU]/mL (ref 0.35–5.50)

## 2024-03-24 LAB — VITAMIN D 25 HYDROXY (VIT D DEFICIENCY, FRACTURES): VITD: 19.42 ng/mL — ABNORMAL LOW (ref 30.00–100.00)

## 2024-03-24 MED ORDER — MELOXICAM 7.5 MG PO TABS: ORAL_TABLET | ORAL | 3 refills | Status: AC

## 2024-03-24 MED ORDER — VITAMIN D (ERGOCALCIFEROL) 1.25 MG (50000 UNIT) PO CAPS: 50000.0000 [IU] | ORAL_CAPSULE | ORAL | 0 refills | Status: AC

## 2024-03-24 MED ORDER — SIMVASTATIN 20 MG PO TABS: 20.0000 mg | ORAL_TABLET | Freq: Every day | ORAL | 1 refills | Status: AC

## 2024-03-24 MED ORDER — ALENDRONATE SODIUM 10 MG PO TABS: 10.0000 mg | ORAL_TABLET | Freq: Every day | ORAL | 1 refills | Status: AC

## 2024-03-24 MED ORDER — LEVOTHYROXINE SODIUM 50 MCG PO TABS: 50.0000 ug | ORAL_TABLET | Freq: Every day | ORAL | 1 refills | Status: AC

## 2024-03-24 NOTE — Assessment & Plan Note (Signed)
 Repeat Tsh and T4 Maintain med dose while waiting for lab results Wt Readings from Last 3 Encounters:  03/24/24 116 lb (52.6 kg)  09/22/23 115 lb 3.2 oz (52.3 kg)  09/16/23 112 lb (50.8 kg)

## 2024-03-24 NOTE — Progress Notes (Signed)
 "               Established Patient Visit  Patient: Lindsay Ferguson   DOB: 1945-09-10   79 y.o. Female  MRN: [id number] Visit Date: 03/24/2024  Subjective:    Chief Complaint  Patient presents with   Follow-up    FASTING 6 month follow up for Hypothyroidism, hyperlipidemia  Discuss Fosamax   Refills requested    HPI Hypothyroidism, unspecified Repeat Tsh and T4 Maintain med dose while waiting for lab results Wt Readings from Last 3 Encounters:  03/24/24 116 lb (52.6 kg)  09/22/23 115 lb 3.2 oz (52.3 kg)  09/16/23 112 lb (50.8 kg)     Osteoporosis Consistent use of fosamax  daily x 58yrs Does not take calcium and vit. D as recommended. Last dexa scan 04/2022.  Advised to start calcium 1200mg  and vit. D3 1000IU daily Maintain fosamax  dose Repeat dexa scan  Hyperlipidemia Repeat lipid panel Maintain zocor  dose     03/24/2024    8:41 AM  MMSE - Mini Mental State Exam  Orientation to time 5  Orientation to Place 5  Registration 3  Attention/ Calculation 5  Recall 3  Language- name 2 objects 2  Language- repeat 1  Language- follow 3 step command 3  Language- read & follow direction 1  Write a sentence 1  Copy design 1  Total score 30    Reviewed medical, surgical, and social history today  Medications: Show/hide medication list[1] Reviewed past medical and social history.   ROS per HPI above      Objective:  BP 118/72 (BP Location: Left Arm, Patient Position: Sitting, Cuff Size: Normal)   Pulse 68   Temp 97.6 F (36.4 C) (Oral)   Ht 4' 11 (1.499 m)   Wt 116 lb (52.6 kg)   SpO2 98%   BMI 23.43 kg/m      Physical Exam Vitals and nursing note reviewed.  Cardiovascular:     Rate and Rhythm: Normal rate and regular rhythm.     Pulses: Normal pulses.     Heart sounds: Normal heart sounds.  Pulmonary:     Effort: Pulmonary effort is normal.     Breath sounds: Normal breath sounds.  Neurological:     Mental Status: She is alert and oriented to  person, place, and time.  Psychiatric:        Mood and Affect: Mood normal.        Thought Content: Thought content normal.     No results found for any visits on 03/24/24.    Assessment & Plan:    Problem List Items Addressed This Visit     Chronic midline low back pain without sciatica   Relevant Medications   meloxicam  (MOBIC ) 7.5 MG tablet   Chronic pain of multiple joints   Relevant Medications   meloxicam  (MOBIC ) 7.5 MG tablet   Hyperlipidemia   Repeat lipid panel Maintain zocor  dose      Relevant Medications   simvastatin  (ZOCOR ) 20 MG tablet   Other Relevant Orders   Comprehensive metabolic panel with GFR   Lipid panel   Hypothyroidism, unspecified   Repeat Tsh and T4 Maintain med dose while waiting for lab results Wt Readings from Last 3 Encounters:  03/24/24 116 lb (52.6 kg)  09/22/23 115 lb 3.2 oz (52.3 kg)  09/16/23 112 lb (50.8 kg)         Relevant Medications   levothyroxine  (SYNTHROID ) 50 MCG tablet   Other Relevant Orders  T4, free   TSH   Osteoporosis - Primary   Consistent use of fosamax  daily x 54yrs Does not take calcium and vit. D as recommended. Last dexa scan 04/2022.  Advised to start calcium 1200mg  and vit. D3 1000IU daily Maintain fosamax  dose Repeat dexa scan      Relevant Medications   alendronate  (FOSAMAX ) 10 MG tablet   Other Relevant Orders   DG Bone Density   VITAMIN D  25 Hydroxy (Vit-D Deficiency, Fractures)   Comprehensive metabolic panel with GFR   Return in about 6 months (around 09/21/2024) for Hypothyroidism, hyperlipidemia (fasting).     Roselie Mood, NP      [1]  Outpatient Medications Prior to Visit  Medication Sig   acyclovir  (ZOVIRAX ) 400 MG tablet TAKE 1 TABLET(400 MG) BY MOUTH FOUR TIMES DAILY AS NEEDED   aspirin  EC 81 MG tablet Take 1 tablet (81 mg total) by mouth 2 (two) times daily. Swallow whole.   calcium carbonate (OSCAL) 1500 (600 Ca) MG TABS tablet Take 1 tablet by mouth 2 (two) times daily  with a meal.   cholecalciferol (VITAMIN D3) 25 MCG (1000 UNIT) tablet Take 1,000 Units by mouth daily.   gabapentin  (NEURONTIN ) 300 MG capsule TAKE 1 CAPSULE BY MOUTH EVERY AM AND 2 CAPSULES BY MOUTH AT BEDTIME (Patient taking differently: Take 300 mg by mouth at bedtime. Take 1 capsule by mouth daily at bedtime)   metoprolol  succinate (TOPROL  XL) 25 MG 24 hr tablet Take 0.5 tablets (12.5 mg total) by mouth daily.   [DISCONTINUED] alendronate  (FOSAMAX ) 10 MG tablet TAKE 1 TABLET(10 MG) BY MOUTH DAILY BEFORE BREAKFAST WITH A FULL GLASS OF WATER  AND ON AN EMPTY STOMACH   [DISCONTINUED] levothyroxine  (SYNTHROID ) 50 MCG tablet Take 1 tablet (50 mcg total) by mouth daily before breakfast.   [DISCONTINUED] meloxicam  (MOBIC ) 7.5 MG tablet TAKE 1 TABLET(7.5 MG) BY MOUTH DAILY WITH FOOD   [DISCONTINUED] simvastatin  (ZOCOR ) 20 MG tablet TAKE 1 TABLET(20 MG) BY MOUTH AT BEDTIME   No facility-administered medications prior to visit.   "

## 2024-03-24 NOTE — Assessment & Plan Note (Signed)
 Repeat lipid panel Maintain zocor  dose

## 2024-03-24 NOTE — Patient Instructions (Addendum)
 Go to lab Start calcium 1200mg  and vit.D3 1000IU daily Maintain other med doses Schedule appointment for bone density at the front desk. Bring copy of living will and health care power of attorney.

## 2024-03-24 NOTE — Assessment & Plan Note (Signed)
 Consistent use of fosamax  daily x 57yrs Does not take calcium and vit. D as recommended. Last dexa scan 04/2022.  Advised to start calcium 1200mg  and vit. D3 1000IU daily Maintain fosamax  dose Repeat dexa scan

## 2024-07-02 ENCOUNTER — Ambulatory Visit

## 2024-09-21 ENCOUNTER — Ambulatory Visit: Admitting: Nurse Practitioner
